# Patient Record
Sex: Male | Born: 1985 | Race: White | Hispanic: No | Smoking: Current every day smoker
Health system: Southern US, Community
[De-identification: ages and names within clinical notes are randomized; demographics above are authoritative.]

## PROBLEM LIST (undated history)

## (undated) DIAGNOSIS — F1910 Other psychoactive substance abuse, uncomplicated: Secondary | ICD-10-CM

## (undated) DIAGNOSIS — R569 Unspecified convulsions: Secondary | ICD-10-CM

## (undated) DIAGNOSIS — F1420 Cocaine dependence, uncomplicated: Secondary | ICD-10-CM

## (undated) DIAGNOSIS — I10 Essential (primary) hypertension: Secondary | ICD-10-CM

## (undated) DIAGNOSIS — F329 Major depressive disorder, single episode, unspecified: Secondary | ICD-10-CM

## (undated) DIAGNOSIS — B1920 Unspecified viral hepatitis C without hepatic coma: Secondary | ICD-10-CM

## (undated) DIAGNOSIS — F32A Depression, unspecified: Secondary | ICD-10-CM

## (undated) HISTORY — DX: Major depressive disorder, single episode, unspecified: F32.9

## (undated) HISTORY — DX: Essential (primary) hypertension: I10

## (undated) HISTORY — DX: Depression, unspecified: F32.A

---

## 2010-04-30 ENCOUNTER — Emergency Department (HOSPITAL_BASED_OUTPATIENT_CLINIC_OR_DEPARTMENT_OTHER)
Admission: EM | Admit: 2010-04-30 | Discharge: 2010-04-30 | Disposition: A | Discharge status: Home / Self Care | Payer: Medicaid - Out of State | Attending: Emergency Medicine | Admitting: Emergency Medicine

## 2010-04-30 DIAGNOSIS — F1120 Opioid dependence, uncomplicated: Secondary | ICD-10-CM | POA: Insufficient documentation

## 2010-04-30 DIAGNOSIS — F19939 Other psychoactive substance use, unspecified with withdrawal, unspecified: Secondary | ICD-10-CM | POA: Insufficient documentation

## 2010-04-30 LAB — CBC
HCT: 43.3 % (ref 39.0–52.0)
Hemoglobin: 15.6 g/dL (ref 13.0–17.0)
MCH: 29.1 pg (ref 26.0–34.0)
MCHC: 36 g/dL (ref 30.0–36.0)
MCV: 80.8 fL (ref 78.0–100.0)
RBC: 5.36 MIL/uL (ref 4.22–5.81)

## 2010-04-30 LAB — BASIC METABOLIC PANEL
BUN: 8 mg/dL (ref 6–23)
CO2: 28 mEq/L (ref 19–32)
Calcium: 9.8 mg/dL (ref 8.4–10.5)
Chloride: 104 mEq/L (ref 96–112)
Creatinine, Ser: 0.8 mg/dL (ref 0.4–1.5)
Glucose, Bld: 125 mg/dL — ABNORMAL HIGH (ref 70–99)

## 2010-04-30 LAB — DIFFERENTIAL
Basophils Relative: 0 % (ref 0–1)
Lymphocytes Relative: 12 % (ref 12–46)
Lymphs Abs: 1.1 10*3/uL (ref 0.7–4.0)
Monocytes Absolute: 0.5 10*3/uL (ref 0.1–1.0)
Monocytes Relative: 5 % (ref 3–12)
Neutro Abs: 7.4 10*3/uL (ref 1.7–7.7)

## 2010-05-02 ENCOUNTER — Emergency Department (HOSPITAL_BASED_OUTPATIENT_CLINIC_OR_DEPARTMENT_OTHER)
Admission: EM | Admit: 2010-05-02 | Discharge: 2010-05-02 | Disposition: A | Discharge status: Home / Self Care | Payer: Medicaid - Out of State | Attending: Emergency Medicine | Admitting: Emergency Medicine

## 2010-05-02 DIAGNOSIS — F17200 Nicotine dependence, unspecified, uncomplicated: Secondary | ICD-10-CM | POA: Insufficient documentation

## 2010-05-02 DIAGNOSIS — F1910 Other psychoactive substance abuse, uncomplicated: Secondary | ICD-10-CM | POA: Insufficient documentation

## 2012-03-04 ENCOUNTER — Encounter (HOSPITAL_COMMUNITY): Payer: Self-pay | Admitting: *Deleted

## 2012-03-04 ENCOUNTER — Emergency Department (HOSPITAL_COMMUNITY)
Admission: EM | Admit: 2012-03-04 | Discharge: 2012-03-05 | Disposition: A | Discharge status: Home / Self Care | Payer: Self-pay | Attending: Emergency Medicine | Admitting: Emergency Medicine

## 2012-03-04 DIAGNOSIS — F1020 Alcohol dependence, uncomplicated: Secondary | ICD-10-CM | POA: Clinically undetermined

## 2012-03-04 DIAGNOSIS — F1210 Cannabis abuse, uncomplicated: Secondary | ICD-10-CM | POA: Diagnosis present

## 2012-03-04 DIAGNOSIS — F1120 Opioid dependence, uncomplicated: Secondary | ICD-10-CM | POA: Diagnosis present

## 2012-03-04 DIAGNOSIS — F1910 Other psychoactive substance abuse, uncomplicated: Secondary | ICD-10-CM | POA: Insufficient documentation

## 2012-03-04 DIAGNOSIS — R110 Nausea: Secondary | ICD-10-CM | POA: Insufficient documentation

## 2012-03-04 DIAGNOSIS — F17200 Nicotine dependence, unspecified, uncomplicated: Secondary | ICD-10-CM | POA: Insufficient documentation

## 2012-03-04 HISTORY — DX: Other psychoactive substance abuse, uncomplicated: F19.10

## 2012-03-04 LAB — RAPID URINE DRUG SCREEN, HOSP PERFORMED
Amphetamines: NOT DETECTED
Benzodiazepines: NOT DETECTED
Opiates: POSITIVE — AB

## 2012-03-04 LAB — CBC
HCT: 45.7 % (ref 39.0–52.0)
MCH: 31.2 pg (ref 26.0–34.0)
MCV: 88.1 fL (ref 78.0–100.0)
Platelets: 282 10*3/uL (ref 150–400)
RDW: 12.8 % (ref 11.5–15.5)

## 2012-03-04 LAB — COMPREHENSIVE METABOLIC PANEL
AST: 34 U/L (ref 0–37)
Albumin: 3.8 g/dL (ref 3.5–5.2)
BUN: 10 mg/dL (ref 6–23)
Calcium: 9.3 mg/dL (ref 8.4–10.5)
Creatinine, Ser: 0.85 mg/dL (ref 0.50–1.35)
GFR calc non Af Amer: >90 mL/min (ref >90)
Total Bilirubin: 0.6 mg/dL (ref 0.3–1.2)

## 2012-03-04 LAB — ETHANOL: Alcohol, Ethyl (B): <11 mg/dL (ref 0–11)

## 2012-03-04 LAB — SALICYLATE LEVEL: Salicylate Lvl: <2 mg/dL — ABNORMAL LOW (ref 2.8–20.0)

## 2012-03-04 MED ORDER — CLONIDINE HCL 0.1 MG PO TABS: 0.1000 mg | ORAL_TABLET | Freq: Every day | ORAL | Status: DC

## 2012-03-04 MED ORDER — ONDANSETRON 4 MG PO TBDP
4.0000 mg | ORAL_TABLET | Freq: Four times a day (QID) | ORAL | Status: DC | PRN
  Administered 2012-03-05 (×2): 4 mg via ORAL
  Filled 2012-03-04 (×2): qty 1

## 2012-03-04 MED ORDER — CHLORDIAZEPOXIDE HCL 25 MG PO CAPS
25.0000 mg | ORAL_CAPSULE | Freq: Four times a day (QID) | ORAL | Status: DC | PRN
  Administered 2012-03-04 – 2012-03-05 (×3): 25 mg via ORAL
  Filled 2012-03-04 (×3): qty 1

## 2012-03-04 MED ORDER — ADULT MULTIVITAMIN W/MINERALS CH
1.0000 | ORAL_TABLET | Freq: Every day | ORAL | Status: DC
  Administered 2012-03-04 – 2012-03-05 (×2): 1 via ORAL
  Filled 2012-03-04: qty 1

## 2012-03-04 MED ORDER — ADULT MULTIVITAMIN W/MINERALS CH
1.0000 | ORAL_TABLET | Freq: Every day | ORAL | Status: DC
  Administered 2012-03-04: 1 via ORAL
  Filled 2012-03-04: qty 1

## 2012-03-04 MED ORDER — CLONIDINE HCL 0.1 MG PO TABS: 0.1000 mg | ORAL_TABLET | ORAL | Status: DC

## 2012-03-04 MED ORDER — LORAZEPAM 1 MG PO TABS
0.0000 mg | ORAL_TABLET | Freq: Four times a day (QID) | ORAL | Status: DC
  Administered 2012-03-04: 2 mg via ORAL
  Filled 2012-03-04: qty 2

## 2012-03-04 MED ORDER — VITAMIN B-1 100 MG PO TABS
100.0000 mg | ORAL_TABLET | Freq: Every day | ORAL | Status: DC
  Administered 2012-03-05: 100 mg via ORAL
  Filled 2012-03-04: qty 1

## 2012-03-04 MED ORDER — LORAZEPAM 1 MG PO TABS: 1.0000 mg | ORAL_TABLET | Freq: Four times a day (QID) | ORAL | Status: DC | PRN

## 2012-03-04 MED ORDER — THIAMINE HCL 100 MG/ML IJ SOLN: 100.0000 mg | Freq: Every day | INTRAMUSCULAR | Status: DC

## 2012-03-04 MED ORDER — FOLIC ACID 1 MG PO TABS
1.0000 mg | ORAL_TABLET | Freq: Every day | ORAL | Status: DC
  Administered 2012-03-04: 1 mg via ORAL
  Filled 2012-03-04: qty 1

## 2012-03-04 MED ORDER — THIAMINE HCL 100 MG/ML IJ SOLN: 100.0000 mg | Freq: Once | INTRAMUSCULAR | Status: DC

## 2012-03-04 MED ORDER — METHOCARBAMOL 500 MG PO TABS: 500.0000 mg | ORAL_TABLET | Freq: Three times a day (TID) | ORAL | Status: DC | PRN

## 2012-03-04 MED ORDER — DICYCLOMINE HCL 20 MG PO TABS
20.0000 mg | ORAL_TABLET | Freq: Four times a day (QID) | ORAL | Status: DC | PRN
  Administered 2012-03-05 (×2): 20 mg via ORAL
  Filled 2012-03-04 (×2): qty 1

## 2012-03-04 MED ORDER — VITAMIN B-1 100 MG PO TABS
100.0000 mg | ORAL_TABLET | Freq: Every day | ORAL | Status: DC
  Administered 2012-03-04: 100 mg via ORAL
  Filled 2012-03-04: qty 1

## 2012-03-04 MED ORDER — ACETAMINOPHEN 325 MG PO TABS: 650.0000 mg | ORAL_TABLET | ORAL | Status: DC | PRN

## 2012-03-04 MED ORDER — LORAZEPAM 1 MG PO TABS: 0.0000 mg | ORAL_TABLET | Freq: Two times a day (BID) | ORAL | Status: DC

## 2012-03-04 MED ORDER — LOPERAMIDE HCL 2 MG PO CAPS: 2.0000 mg | ORAL_CAPSULE | ORAL | Status: DC | PRN

## 2012-03-04 MED ORDER — NAPROXEN 500 MG PO TABS
500.0000 mg | ORAL_TABLET | Freq: Two times a day (BID) | ORAL | Status: DC | PRN
  Administered 2012-03-05 (×2): 500 mg via ORAL
  Filled 2012-03-04 (×2): qty 1

## 2012-03-04 MED ORDER — IBUPROFEN 200 MG PO TABS: 600.0000 mg | ORAL_TABLET | Freq: Three times a day (TID) | ORAL | Status: DC | PRN

## 2012-03-04 MED ORDER — LORAZEPAM 1 MG PO TABS: 1.0000 mg | ORAL_TABLET | Freq: Three times a day (TID) | ORAL | Status: DC | PRN

## 2012-03-04 MED ORDER — CLONIDINE HCL 0.1 MG PO TABS
0.1000 mg | ORAL_TABLET | Freq: Four times a day (QID) | ORAL | Status: DC
  Administered 2012-03-05 (×2): 0.1 mg via ORAL
  Filled 2012-03-04 (×3): qty 1

## 2012-03-04 MED ORDER — HYDROXYZINE HCL 25 MG PO TABS: 25.0000 mg | ORAL_TABLET | Freq: Four times a day (QID) | ORAL | Status: DC | PRN

## 2012-03-04 MED ORDER — LORAZEPAM 2 MG/ML IJ SOLN: 1.0000 mg | Freq: Four times a day (QID) | INTRAMUSCULAR | Status: DC | PRN

## 2012-03-04 NOTE — Progress Notes (Signed)
Pt referred to [facility], no detox beds available today.  Pt referred to [facility], pending review.  Pt referred to [facility] pending review.   Catha Gosselin, LCSWA  [phone] .03/04/2012 1627pm

## 2012-03-04 NOTE — ED Notes (Signed)
Given 1 coke to drink and 2 sandwiches to eat.

## 2012-03-04 NOTE — ED Notes (Signed)
Patient is here to detox off opiates and ETOH, wants to be clean and start life over as far as drugs are concerned. Pleasant and cooperative, affect blunt, easy to talk to. Just wants to get clean.

## 2012-03-04 NOTE — ED Notes (Signed)
Pt changed in blue scrubs, 2 pt belonging bags at desk, security called for wanding.

## 2012-03-04 NOTE — Progress Notes (Addendum)
Per discussion with [facility], [facility] to [facility] for detox. Pt requiring assistance for transportation. Pt will be provided train ticket by csw for tomorrow. Day CSW will follow up with [facility] regarding patient arrival time to train station.   Doree Albee  [phone] 03/04/2012 1804pm   Addendum:   Pt agreed to be transferred to [facility] for detox treatment. Day CSW will provide patient with bus pass, and train ticket. [facility] will provide transportation for patient from train station to facility. Patient provided with outpatient substance abuse resources as well as counseling services for once patient finishes detox and waiting for residential treatment. CSW unable to obtain authorization since patient is discharging to [facility] tomorrow. CSW informed act team member to see if oncoming act team can obtain authorization after midnight tonight.   Catha Gosselin, LCSWA  [phone] .03/04/2012 1804pm

## 2012-03-04 NOTE — ED Notes (Signed)
Pt states he's here for detox from opiates (heroin)  and alcohol, states last time used both was yesterday at 2 pm, usually drinks about 4 40's a day, usually uses 2 bags of heroin a day, denies SI/HI. Pt states feeling anxious and having abdominal pain right now.

## 2012-03-04 NOTE — BH Assessment (Addendum)
Assessment Note   Thomas Benitez is an 27 y.o. male who presents to the ED requesting detox from heroine and alcohol. CSW met with pt at bedside to complete [facility] assessment. Pt denies SI/AH/VH/HI. Pt reports drinking 4 40oz beers per day for the past 5 months. Pt last drink was yesterday which was 1 40 oz beer. Pt reports using 2 bags of heroine per day, for the past 5 months. Pt reports his last use was yesterday. Pt reports that he has been on and off of heroine and alcohol. Pt reports that this past month he has increased his usage as patient reports feeling depressed. Patient states that he wants to get of drugs and alcohol and follow up with [facility] for residential rehab program once completing detox.   Pt reports symptoms of depression including: increased irritability, decreased appetite (10 lbs lost over past monhth), increased fatigue, thoughts of hoplessness, and isolating.   Pt reports having a few friends, but no real support.   Addendum: Pt reports occasional marijuana use, pt reports only a hit every now and then including yesterday.    Axis I: Polysubstance abuse , depressive disorder nos  Axis II: Deferred Axis III:  Past Medical History  Diagnosis Date  . Substance abuse    Axis IV: other psychosocial or environmental problems, problems related to social environment and problems with primary support group Axis V: 41-50 serious symptoms  Past Medical History:  Past Medical History  Diagnosis Date  . Substance abuse     History reviewed. No pertinent past surgical history.  Family History: History reviewed. No pertinent family history.  Social History:  reports that he has been smoking.  He has never used smokeless tobacco. He reports that  drinks alcohol. He reports that he uses illicit drugs (Marijuana).  Additional Social History:  Alcohol / Drug Use History of alcohol / drug use?: Yes Substance #1 Name of Substance 1: Etoh  1 - Age of First Use: 12 1 -  Amount (size/oz): 4 4 locos 1 - Frequency: daily  1 - Duration: 5 months  1 - Last Use / Amount: yesterday 1 40 oz beer  Substance #2 Name of Substance 2: heroine 2 - Age of First Use: 19  2 - Amount (size/oz): 2 bags  2 - Frequency: daily  2 - Duration: 5 months 2 - Last Use / Amount: yesterday 2 bags  CIWA: CIWA-Ar BP: 120/77 mmHg Pulse Rate: 66 Nausea and Vomiting: mild nausea with no vomiting Tactile Disturbances: mild itching, pins and needles, burning or numbness Tremor: two Auditory Disturbances: not present Paroxysmal Sweats: two Visual Disturbances: not present Anxiety: two Headache, Fullness in Head: very mild Agitation: three Orientation and Clouding of Sensorium: oriented and can do serial additions CIWA-Ar Total: 13 COWS: Clinical Opiate Withdrawal Scale (COWS) Resting Pulse Rate: Pulse Rate 80 or below Sweating: Subjective report of chills or flushing Restlessness: Frequent shifting or extraneous movements of legs/arms Pupil Size: Pupils pinned or normal size for room light Bone or Joint Aches: Mild diffuse discomfort Runny Nose or Tearing: Not present GI Upset: nausea or loose stool Tremor: Slight tremor observable Yawning: No yawning Anxiety or Irritability: Patient reports increasing irritability or anxiousness Gooseflesh Skin: Skin is smooth COWS Total Score: 10  Allergies:  Allergies  Allergen Reactions  . Shellfish Allergy     Home Medications:  (Not in a hospital admission)  OB/GYN Status:  No LMP for male patient.  General Assessment Data Location of Assessment: [facility]  Living Arrangements: Alone Can pt return to current living arrangement?: Yes Admission Status: Voluntary Is patient capable of signing voluntary admission?: Yes Transfer from: Home Referral Source: Self/Family/Friend  Education Status Is patient currently in school?: No Highest grade of school patient has completed: 9th  Risk to self Suicidal Ideation: No Suicidal  Intent: No Is patient at risk for suicide?: No Suicidal Plan?: No Access to Means: No What has been your use of drugs/alcohol within the last 12 months?: heroine and alcohol Previous Attempts/Gestures: No How many times?: 0 Other Self Harm Risks: no Triggers for Past Attempts: None known Intentional Self Injurious Behavior: None Family Suicide History: No Recent stressful life event(s): Conflict (Comment);Loss (Comment) Persecutory voices/beliefs?: No Depression: Yes Depression Symptoms: Despondent;Insomnia;Isolating;Fatigue;Loss of interest in usual pleasures;Feeling worthless/self pity;Feeling angry/irritable Substance abuse history and/or treatment for substance abuse?: Yes  Risk to Others Homicidal Ideation: No Thoughts of Harm to Others: No Current Homicidal Intent: No Current Homicidal Plan: No Access to Homicidal Means: No Identified Victim: n/a History of harm to others?: No Assessment of Violence: None Noted Violent Behavior Description: none Does patient have access to weapons?: No Criminal Charges Pending?: No Does patient have a court date: No  Psychosis Hallucinations: None noted Delusions: None noted  Mental Status Report Appear/Hygiene: Other (Comment) (calm and casual) Eye Contact: Good Motor Activity: Freedom of movement Speech: Logical/coherent Level of Consciousness: Alert;Irritable Mood: Irritable Affect: Appropriate to circumstance Anxiety Level: Minimal Thought Processes: Coherent;Relevant Judgement: Unimpaired Orientation: Person;Place;Time;Situation Obsessive Compulsive Thoughts/Behaviors: None  Cognitive Functioning Concentration: Normal Memory: Recent Intact;Remote Intact IQ: Average Insight: Fair Impulse Control: Poor Appetite: Fair Weight Loss: 10 Sleep: No Change Total Hours of Sleep: 4 Vegetative Symptoms: None  ADLScreening [facility]) Patient's cognitive ability adequate to safely complete daily activities?:  Yes Patient able to express need for assistance with ADLs?: Yes Independently performs ADLs?: Yes (appropriate for developmental age)  Abuse/Neglect [facility]) Physical Abuse: Denies Verbal Abuse: Denies Sexual Abuse: Denies  Prior Inpatient Therapy Prior Inpatient Therapy: Yes Prior Therapy Dates: 2013  Prior Therapy Facilty/Provider(s): [facility] Reason for Treatment: etoh   Prior Outpatient Therapy Prior Outpatient Therapy: No Prior Therapy Dates: n/a Prior Therapy Facilty/Provider(s): n/a Reason for Treatment: n/a  ADL Screening (condition at time of admission) Patient's cognitive ability adequate to safely complete daily activities?: Yes Patient able to express need for assistance with ADLs?: Yes Independently performs ADLs?: Yes (appropriate for developmental age)       Abuse/Neglect Assessment (Assessment to be complete while patient is alone) Physical Abuse: Denies Verbal Abuse: Denies Sexual Abuse: Denies Values / Beliefs Cultural Requests During Hospitalization: None Spiritual Requests During Hospitalization: None        Additional Information 1:1 In Past 12 Months?: No CIRT Risk: No Elopement Risk: No Does patient have medical clearance?: Yes     Disposition:  Disposition Disposition of Patient: Inpatient treatment program Type of inpatient treatment program: Adult  On Site Evaluation by:   Reviewed with Physician:     Catha Gosselin A 03/04/2012 3:23 PM

## 2012-03-04 NOTE — Consult Note (Signed)
Reason for Consult: Opiate dependence alcohol dependence seeking for detox treatment Referring Physician: Dr. Lemar Livings is an 27 y.o. male.  HPI: Patient was seen and chart reviewed. Patient came voluntarily to the [facility] long emergency department requesting detox treatment for opiates and alcohol. Patient reportedly has been using the elbow drugs and multiple other drugs since he was in high school. Patient stated he started using as a recreational with friends and now he likes using it and he denies getting high. Reportedly patient lived in [place] until 2 years ago where his father lives. He was visiting his mother and likes to get and then decided to stay here. 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 Patient stated when he does not get enough for drugs he gets sick and cannot work which is difficult for him not to get drugs. Patient stated he was sick nauseated shaking sweating hot and cold clammy. Patient stated he wanted to quit using drugs this time. Patient has a previous detox treatment at [facility] and also rehabilitation services at [facility] recovery. Patient has denied previously behavioral health admissions or treatments.   MSE: This is a young Caucasian male poorly groomed, decreased psychomotor activity, calm, and cooperative. He has normal rate rhythm and volume of speech and thought processes are linear and goal-directed. Patient has denied suicidal onset ideation intents or plans, Patient has no evidence of psychotic symptoms.   Past Medical History  Diagnosis Date  . Substance abuse     History reviewed. No pertinent past surgical history.  History reviewed. No pertinent family history.  Social History:  reports that he has been smoking.  He has never used smokeless tobacco. He reports that  drinks alcohol. He reports that he uses illicit drugs (Marijuana).  Allergies:  Allergies   Allergen Reactions  . Shellfish Allergy     Medications: I have reviewed the patient's current medications.  Results for orders placed during the hospital encounter of 03/04/12 (from the past 48 hour(s))  ACETAMINOPHEN LEVEL     Status: None   Collection Time    03/04/12  1:06 PM      Result Value Range   Acetaminophen (Tylenol), Serum <15.0  10 - 30 ug/mL   Comment:            THERAPEUTIC CONCENTRATIONS VARY     SIGNIFICANTLY. A RANGE OF 10-30     ug/mL MAY BE AN EFFECTIVE     CONCENTRATION FOR MANY PATIENTS.     HOWEVER, SOME ARE BEST TREATED     AT CONCENTRATIONS OUTSIDE THIS     RANGE.     ACETAMINOPHEN CONCENTRATIONS     >150 ug/mL AT 4 HOURS AFTER     INGESTION AND >50 ug/mL AT 12     HOURS AFTER INGESTION ARE     OFTEN ASSOCIATED WITH TOXIC     REACTIONS.  CBC     Status: None   Collection Time    03/04/12  1:06 PM      Result Value Range   WBC 7.5  4.0 - 10.5 K/uL   RBC 5.19  4.22 - 5.81 MIL/uL   Hemoglobin 16.2  13.0 - 17.0 g/dL   HCT 40.9  81.1 - 91.4 %   MCV 88.1  78.0 - 100.0 fL   MCH 31.2  26.0 - 34.0 pg   MCHC 35.4  30.0 - 36.0 g/dL  RDW 12.8  11.5 - 15.5 %   Platelets 282  150 - 400 K/uL  COMPREHENSIVE METABOLIC PANEL     Status: Abnormal   Collection Time    03/04/12  1:06 PM      Result Value Range   Sodium 135  135 - 145 mEq/L   Potassium 4.0  3.5 - 5.1 mEq/L   Chloride 97  96 - 112 mEq/L   CO2 29  19 - 32 mEq/L   Glucose, Bld 155 (*) 70 - 99 mg/dL   BUN 10  6 - 23 mg/dL   Creatinine, Ser 1.61  0.50 - 1.35 mg/dL   Calcium 9.3  8.4 - 09.6 mg/dL   Total Protein 7.5  6.0 - 8.3 g/dL   Albumin 3.8  3.5 - 5.2 g/dL   AST 34  0 - 37 U/L   ALT 53  0 - 53 U/L   Alkaline Phosphatase 53  39 - 117 U/L   Total Bilirubin 0.6  0.3 - 1.2 mg/dL   GFR calc non Af Amer >90  >90 mL/min   GFR calc Af Amer >90  >90 mL/min   Comment:            The eGFR has been calculated     using the CKD EPI equation.     This calculation has not been     validated in  all clinical     situations.     eGFR's persistently     <90 mL/min signify     possible Chronic Kidney Disease.  ETHANOL     Status: None   Collection Time    03/04/12  1:06 PM      Result Value Range   Alcohol, Ethyl (B) <11  0 - 11 mg/dL   Comment:            LOWEST DETECTABLE LIMIT FOR     SERUM ALCOHOL IS 11 mg/dL     FOR MEDICAL PURPOSES ONLY  SALICYLATE LEVEL     Status: Abnormal   Collection Time    03/04/12  1:06 PM      Result Value Range   Salicylate Lvl <2.0 (*) 2.8 - 20.0 mg/dL  URINE RAPID DRUG SCREEN (HOSP PERFORMED)     Status: Abnormal   Collection Time    03/04/12  1:10 PM      Result Value Range   Opiates POSITIVE (*) NONE DETECTED   Cocaine NONE DETECTED  NONE DETECTED   Benzodiazepines NONE DETECTED  NONE DETECTED   Amphetamines NONE DETECTED  NONE DETECTED   Tetrahydrocannabinol POSITIVE (*) NONE DETECTED   Barbiturates NONE DETECTED  NONE DETECTED   Comment:            DRUG SCREEN FOR MEDICAL PURPOSES     ONLY.  IF CONFIRMATION IS NEEDED     FOR ANY PURPOSE, NOTIFY LAB     WITHIN 5 DAYS.                LOWEST DETECTABLE LIMITS     FOR URINE DRUG SCREEN     Drug Class       Cutoff (ng/mL)     Amphetamine      1000     Barbiturate      200     Benzodiazepine   200     Tricyclics       300     Opiates          300  Cocaine          300     THC              50    No results found.  Positive for anxiety, bad mood, illegal drug usage, mood swings, sleep disturbance and tobacco use Blood pressure 120/77, pulse 66, temperature 97.9 F (36.6 C), temperature source Oral, resp. rate 18, SpO2 96.00%.   Assessment/Plan: Polysubstance dependence Opiate dependence Alcohol abuse Cannabis abuse  Recommendation: Patient will benefit from inpatient alcohol and opiate detox treatment program and then will be referred to the [facility] recovery services for rehabilitation. Patient will start Librium protocol and also Clonidine.   JONNALAGADDA,JANARDHAHA  R. 03/04/2012, 5:02 PM

## 2012-03-04 NOTE — ED Provider Notes (Signed)
History     CSN: [id number]  Arrival date & time 03/04/12  1244   First MD Initiated Contact with Patient 03/04/12 1347      Chief Complaint  Patient presents with  . Medical Clearance  . detox     (Consider location/radiation/quality/duration/timing/severity/associated sxs/prior treatment) HPI Comments: Patient presenting today requesting detox from Heroin and from Alcohol.  Patient reports that he typically drinks four 40's daily.  He also uses Heroin daily.  He reports that he went through detox three months ago, but began using again.  He also occasionally uses Marijuana.  He denies any other recreational drug use.  He denies SI or HI.  He reports that he is feeling anxious, nauseous, and is having mild abdominal pain at this time, which he attributes to alcohol withdrawal.  Last drink was 2 PM yesterday.  The history is provided by the patient.    Past Medical History  Diagnosis Date  . Substance abuse     History reviewed. No pertinent past surgical history.  History reviewed. No pertinent family history.  History  Substance Use Topics  . Smoking status: Current Every Day Smoker  . Smokeless tobacco: Never Used  . Alcohol Use: Yes     Comment: 4 40's a day      Review of Systems  Constitutional: Negative for fever and chills.  Respiratory: Negative for shortness of breath.   Gastrointestinal: Positive for nausea. Negative for vomiting.  Neurological: Negative for dizziness, tremors, syncope, light-headedness and headaches.  Psychiatric/Behavioral: Negative for suicidal ideas, hallucinations and confusion.  All other systems reviewed and are negative.    Allergies  Shellfish allergy  Home Medications  No current outpatient prescriptions on file.  BP 120/77  Pulse 66  Temp(Src) 97.9 F (36.6 C) (Oral)  Resp 18  SpO2 96%  Physical Exam  Nursing note and vitals reviewed. Constitutional: He appears well-developed and well-nourished. No distress.   HENT:  Head: Normocephalic and atraumatic.  Mouth/Throat: Oropharynx is clear and moist.  Eyes: EOM are normal. Pupils are equal, round, and reactive to light.  Neck: Normal range of motion. Neck supple.  Cardiovascular: Normal rate, regular rhythm and normal heart sounds.   Pulmonary/Chest: Effort normal and breath sounds normal.  Abdominal: Soft. Bowel sounds are normal. He exhibits no distension and no mass. There is no tenderness. There is no rebound and no guarding.  Neurological: He is alert.  Skin: Skin is warm and dry. He is not diaphoretic.  Psychiatric: He has a normal mood and affect.    ED Course  Procedures (including critical care time)  Labs Reviewed  COMPREHENSIVE METABOLIC PANEL - Abnormal; Notable for the following:    Glucose, Bld 155 (*)    All other components within normal limits  SALICYLATE LEVEL - Abnormal; Notable for the following:    Salicylate Lvl <2.0 (*)    All other components within normal limits  URINE RAPID DRUG SCREEN (HOSP PERFORMED) - Abnormal; Notable for the following:    Opiates POSITIVE (*)    Tetrahydrocannabinol POSITIVE (*)    All other components within normal limits  ACETAMINOPHEN LEVEL  CBC  ETHANOL   No results found.   No diagnosis found.  Discussed with ACT team.  MDM  Patient requesting detox from Heroin and Alcohol.  Patient uses both daily.  He denies SI or HI.  Discussed the patient with the ACT team.  CIWA orders have been placed.  Psych holding orders have also been placed.  Pascal Lux Th[place], PA-C 03/04/12 1551

## 2012-03-05 MED ORDER — NICOTINE 21 MG/24HR TD PT24
21.0000 mg | MEDICATED_PATCH | Freq: Every day | TRANSDERMAL | Status: DC
  Administered 2012-03-05: 21 mg via TRANSDERMAL
  Filled 2012-03-05: qty 1

## 2012-03-05 NOTE — Treatment Plan (Signed)
Based on pt's clinical presentation he does not meet criteria for inpatient psych services.  He denies SI, HI, and psychosis.  He is positive for opiates and reports ETOH use, however his ETOH level was negative on admission to the ED.  Opioid detox in the absence of ETOH, BENZOS, SI, HI, or psychosis is an outpatient service.

## 2012-03-05 NOTE — BHH Counselor (Signed)
Patient referred to [facility] but declined due to previous behaviors. Patient referred to [facility] and accepted pending authorization. Although patient was accepted to [facility], nature was informed by SW-Gina that patient does not have ID which is mandatory for transportation via train.   SW then referred patient to [facility]. Patient was reviewed by AC-Eric and he stated that patient does not meet criteria. Sts that patient has no alcohol in his system and positive for opiates only. Patient reports that his last drink was 03-03-2012. Writer and [facility] discussed that patient would remain in the ED until tomorrow and discharge home to follow-up with a appropriate substance abuse program such as residential, outpatient,etc.  Clinical research associate shared this plan during the treatment team meeting and everyone involved agreed with plan.  Writer discussed with patient the plan of care which was also discussed during treatment team meeting. Explained to patient that the plan is for him to remain in the ED until tomorrow morning and then he will discharge home to follow up with residential, outpatient programs such as [facility]. Patient stated, "I really want treatment at [facility]". Writer offered to schedule an appointment for patient at [facility]. Patient stated, "No thanks I will schedule my own appointment"...."I'm just ready to go now"...."I don't want to go home now". EDP informed that patient wanted to be discharge. Patient given various substance abuse referrals including [facility].

## 2012-03-05 NOTE — ED Provider Notes (Addendum)
Pt seeking opiate and alcohol detox/rehab. Has been accepted to [facility], to go there is morning. Pt resting comfortably. Nad.   Suzi Roots, MD 03/05/12 [phone]  Act team indicates pt declines at [facility], and does not want to go to [facility]. States [facility] declines admission feeling pt appropriate for outpt referrals.   Act team indicates pt requests d/c to home.  Will provide resource guide.     Suzi Roots, MD 03/05/12 [phone]

## 2012-03-05 NOTE — BHH Counselor (Signed)
Per shift report patient was referred to [facility] 03/05/2012 and would need a authorization. This am Clinical research associate followed up by contacting [facility] this to request the authorization. Their was no response, therefore; writer  left a detailed voicemail requesting a call back.  Meanwhile Clinical research associate met with patient to discuss his disposition. Patient sts that he does not want to go to [facility] and told staff that last night. Writer informed patient that [facility] and [facility] were the 2 local detox facilities. Patient stated that he would prefer [facility]. Writer informed patient that [facility] would be contacted and he would be referred. However, patient was made aware that if [facility] or does not have beds or if he is declined [facility] may be his next option. Patient agreed to the plan stating, "I understand that will be fine".  Writer contacted [facility] and per their staff-Shayla patient is not able return to the facility due to previous behaviors. Writer will continue with placement at [facility] and patient made aware of plan.

## 2012-03-05 NOTE — Clinical Social Work Note (Signed)
Treatment tx reviewed pt chart and discussed disposition.  ACT team has now taken over pt care.  CSW is signing off; however will remain available if needed.  Vickii Penna, LCSWA [phone]  Clinical Social Work

## 2012-03-05 NOTE — Clinical Social Work Note (Addendum)
11:50am- CSW met with pt at bedside.  Pt stated that he did not have a valid photo ID.  Pt also stated that he was not from [place] and had no family here other than his mother and she is not willing to assist with this transportation need.  At this time, pt not having a valid photo ID, pt will not be able to be transported to [facility] via train as you must have a valid ID.  CSW requested pt to be run at [facility] since [facility] may no longer be an option.  CSW faxed over Assessment Verification and followed up with a phone call requesting pt to be run and made [facility] aware of the situation change.  CSW will continue to f/u.  11:40am CSW checked train departure times: 1:34pm and 6:49pm.  CSW will also need to verify that pt has valid state issued ID in order to ride the train.  CSW will assess.    CSW called [facility] for authorization for detox.  [phone].  CSW left message requesting a return call for authorization.  CSW was advised by ACT that no authorization had been received.  Per ACT - ACT has also left a message to request an authorization.  CSW will continue to f/u.    Vickii Penna, LCSWA [phone]  Clinical Social Work

## 2012-03-06 NOTE — ED Provider Notes (Signed)
Medical screening examination/treatment/procedure(s) were performed by non-physician practitioner and as supervising physician I was immediately available for consultation/collaboration.  Anthony T Allen, MD 03/06/12 1350 

## 2012-04-05 ENCOUNTER — Encounter (HOSPITAL_COMMUNITY): Payer: Self-pay | Admitting: Emergency Medicine

## 2012-04-05 ENCOUNTER — Emergency Department (HOSPITAL_COMMUNITY)
Admission: EM | Admit: 2012-04-05 | Discharge: 2012-04-08 | Disposition: A | Discharge status: Home / Self Care | Payer: Medicaid - Out of State | Attending: Emergency Medicine | Admitting: Emergency Medicine

## 2012-04-05 DIAGNOSIS — F411 Generalized anxiety disorder: Secondary | ICD-10-CM | POA: Insufficient documentation

## 2012-04-05 DIAGNOSIS — F1010 Alcohol abuse, uncomplicated: Secondary | ICD-10-CM

## 2012-04-05 DIAGNOSIS — R45851 Suicidal ideations: Secondary | ICD-10-CM

## 2012-04-05 DIAGNOSIS — F1210 Cannabis abuse, uncomplicated: Secondary | ICD-10-CM

## 2012-04-05 DIAGNOSIS — F1120 Opioid dependence, uncomplicated: Secondary | ICD-10-CM

## 2012-04-05 DIAGNOSIS — F1410 Cocaine abuse, uncomplicated: Secondary | ICD-10-CM | POA: Insufficient documentation

## 2012-04-05 DIAGNOSIS — R0789 Other chest pain: Secondary | ICD-10-CM | POA: Insufficient documentation

## 2012-04-05 DIAGNOSIS — R110 Nausea: Secondary | ICD-10-CM | POA: Insufficient documentation

## 2012-04-05 DIAGNOSIS — R450 Nervousness: Secondary | ICD-10-CM | POA: Insufficient documentation

## 2012-04-05 DIAGNOSIS — F1910 Other psychoactive substance abuse, uncomplicated: Secondary | ICD-10-CM

## 2012-04-05 DIAGNOSIS — R509 Fever, unspecified: Secondary | ICD-10-CM | POA: Insufficient documentation

## 2012-04-05 DIAGNOSIS — F14929 Cocaine use, unspecified with intoxication, unspecified: Secondary | ICD-10-CM

## 2012-04-05 DIAGNOSIS — F17200 Nicotine dependence, unspecified, uncomplicated: Secondary | ICD-10-CM | POA: Insufficient documentation

## 2012-04-05 LAB — RAPID URINE DRUG SCREEN, HOSP PERFORMED
Amphetamines: NOT DETECTED
Benzodiazepines: NOT DETECTED
Cocaine: POSITIVE — AB
Opiates: NOT DETECTED
Tetrahydrocannabinol: POSITIVE — AB

## 2012-04-05 LAB — CBC
HCT: 42 % (ref 39.0–52.0)
MCH: 31.1 pg (ref 26.0–34.0)
MCHC: 36.4 g/dL — ABNORMAL HIGH (ref 30.0–36.0)
MCV: 85.4 fL (ref 78.0–100.0)
Platelets: 288 10*3/uL (ref 150–400)
RDW: 12.5 % (ref 11.5–15.5)
WBC: 9.2 10*3/uL (ref 4.0–10.5)

## 2012-04-05 NOTE — ED Notes (Signed)
Pt requesting detox from etoh(last drink 20 min ago), heroin (yesterday) and crack (yesterday). Pt denies SI/HI put states he is very depressed.

## 2012-04-05 NOTE — ED Notes (Signed)
Pt changed into scrubs.  One bag of personal belongings.  Seen and wand by security.

## 2012-04-06 LAB — COMPREHENSIVE METABOLIC PANEL
AST: 55 U/L — ABNORMAL HIGH (ref 0–37)
Albumin: 3.9 g/dL (ref 3.5–5.2)
BUN: 12 mg/dL (ref 6–23)
Calcium: 9.5 mg/dL (ref 8.4–10.5)
Chloride: 101 mEq/L (ref 96–112)
Creatinine, Ser: 0.8 mg/dL (ref 0.50–1.35)
Total Bilirubin: 0.4 mg/dL (ref 0.3–1.2)
Total Protein: 7.8 g/dL (ref 6.0–8.3)

## 2012-04-06 LAB — ETHANOL: Alcohol, Ethyl (B): <11 mg/dL (ref 0–11)

## 2012-04-06 MED ORDER — NICOTINE 21 MG/24HR TD PT24
21.0000 mg | MEDICATED_PATCH | Freq: Once | TRANSDERMAL | Status: DC
  Administered 2012-04-06: 21 mg via TRANSDERMAL
  Filled 2012-04-06 (×2): qty 1

## 2012-04-06 MED ORDER — ONDANSETRON HCL 4 MG PO TABS
4.0000 mg | ORAL_TABLET | Freq: Three times a day (TID) | ORAL | Status: DC | PRN
  Administered 2012-04-06: 4 mg via ORAL
  Filled 2012-04-06: qty 1

## 2012-04-06 MED ORDER — ONDANSETRON HCL 4 MG PO TABS: 4.0000 mg | ORAL_TABLET | Freq: Three times a day (TID) | ORAL | Status: DC | PRN

## 2012-04-06 MED ORDER — ONDANSETRON 4 MG PO TBDP: 4.0000 mg | ORAL_TABLET | Freq: Four times a day (QID) | ORAL | Status: DC | PRN

## 2012-04-06 MED ORDER — NAPROXEN 500 MG PO TABS: 500.0000 mg | ORAL_TABLET | Freq: Two times a day (BID) | ORAL | Status: DC | PRN

## 2012-04-06 MED ORDER — METHOCARBAMOL 500 MG PO TABS: 500.0000 mg | ORAL_TABLET | Freq: Three times a day (TID) | ORAL | Status: DC | PRN

## 2012-04-06 MED ORDER — IBUPROFEN 600 MG PO TABS
600.0000 mg | ORAL_TABLET | Freq: Three times a day (TID) | ORAL | Status: DC | PRN
  Administered 2012-04-07: 600 mg via ORAL
  Filled 2012-04-06: qty 1

## 2012-04-06 MED ORDER — LOPERAMIDE HCL 2 MG PO CAPS: 2.0000 mg | ORAL_CAPSULE | ORAL | Status: DC | PRN

## 2012-04-06 MED ORDER — LORAZEPAM 1 MG PO TABS
1.0000 mg | ORAL_TABLET | Freq: Three times a day (TID) | ORAL | Status: DC | PRN
  Administered 2012-04-06 – 2012-04-08 (×6): 1 mg via ORAL
  Filled 2012-04-06 (×6): qty 1

## 2012-04-06 MED ORDER — DICYCLOMINE HCL 20 MG PO TABS: 20.0000 mg | ORAL_TABLET | Freq: Four times a day (QID) | ORAL | Status: DC | PRN

## 2012-04-06 MED ORDER — ACETAMINOPHEN 325 MG PO TABS: 650.0000 mg | ORAL_TABLET | ORAL | Status: DC | PRN

## 2012-04-06 MED ORDER — HYDROXYZINE HCL 25 MG PO TABS
25.0000 mg | ORAL_TABLET | Freq: Four times a day (QID) | ORAL | Status: DC | PRN
  Administered 2012-04-07: 25 mg via ORAL
  Filled 2012-04-06: qty 1

## 2012-04-06 NOTE — ED Provider Notes (Signed)
Medical screening examination/treatment/procedure(s) were performed by non-physician practitioner and as supervising physician I was immediately available for consultation/collaboration.    Vida Roller, MD 04/06/12 [phone]

## 2012-04-06 NOTE — BHH Counselor (Signed)
Ardelia Mems, assessment staff at [facility], submitted Pt for admission to [facility]. Laverle Hobby, [facility] confirmed bed availability. Armandina Stammer, NP reviewed clinical information and declined Pt citing Pt does not meet criteria for inpatient alcohol detox or mental health crisis stabilization. Notified Ardelia Mems of disposition.  Harlin Rain Patsy Baltimore, LPC, [facility] Assessment Counselor

## 2012-04-06 NOTE — ED Notes (Signed)
Patient pleasant and cooperative with staff. Pt c/o anxiety; Ativan given PRN as ordered. Pt denies SI at this time. No s/s of distress noted.

## 2012-04-06 NOTE — ED Provider Notes (Signed)
History     CSN: [id number]  Arrival date & time 04/05/12  2217   First MD Initiated Contact with Patient 04/05/12 2343      Chief Complaint  Patient presents with  . Drug / Alcohol Assessment    (Consider location/radiation/quality/duration/timing/severity/associated sxs/prior treatment) HPI  Patient is 27 yo M PMHx substance abuse presenting to ED for ETOH detox. Has history of ETOH, cocaine, and heroin abuse. Last use ETOH tonight prior to ED, cocaine yesterday, heroin yesterday. Patient has never been hospitalized for withdrawals, no history of ETOH withdrawal seizures. Has previously tried to go through detox, but has been unsuccessful. Last attempt was one month ago, patient left after 24 hours of detox. No suicidal or homicidal ideations, no auditory or visual hallucinations. No other PMHx. Patient has nausea, chills, and anxiety.   Past Medical History  Diagnosis Date  . Substance abuse     History reviewed. No pertinent past surgical history.  No family history on file.  History  Substance Use Topics  . Smoking status: Current Every Day Smoker  . Smokeless tobacco: Never Used  . Alcohol Use: Yes     Comment: 4 40's a day      Review of Systems  Constitutional: Positive for fever.  HENT: Negative.   Eyes: Negative.   Respiratory: Positive for chest tightness. Negative for cough, shortness of breath and wheezing.   Cardiovascular: Negative for chest pain.  Gastrointestinal: Positive for nausea. Negative for abdominal pain.  Genitourinary: Negative.   Skin: Negative.   Neurological: Negative.   Psychiatric/Behavioral: Negative for sleep disturbance. The patient is nervous/anxious.     Allergies  Shellfish allergy  Home Medications  No current outpatient prescriptions on file.  BP 128/92  Pulse 76  Temp(Src) 98 F (36.7 C) (Oral)  Resp 20  Wt 161 lb (73.029 kg)  SpO2 96%  Physical Exam  Constitutional: He is oriented to person, place, and time. He  appears well-developed and well-nourished. No distress.  HENT:  Head: Normocephalic and atraumatic.  Mouth/Throat: Oropharynx is clear and moist.  Eyes: Conjunctivae are normal.  Neck: Neck supple.  Cardiovascular: Normal rate, regular rhythm, normal heart sounds and intact distal pulses.   Pulmonary/Chest: Effort normal and breath sounds normal. No respiratory distress. He has no wheezes. He has no rales. He exhibits no tenderness.  Abdominal: Soft. There is no tenderness.  Neurological: He is alert and oriented to person, place, and time.  Skin: Skin is warm and dry. No rash noted. He is not diaphoretic.  Psychiatric: His speech is normal and behavior is normal. Judgment and thought content normal. His mood appears anxious. Cognition and memory are normal.    ED Course  Procedures (including critical care time)  Patient offered option of waiting for ACT consult for possible placement or phone numbers for outpatient detox clinics to contact himself. Patient opting to stay for ACT team.    ACT consulted.   Psych hold orders in place.   Medications  LORazepam (ATIVAN) tablet 1 mg (not administered)  ondansetron (ZOFRAN) tablet 4 mg (not administered)     Labs Reviewed  CBC - Abnormal; Notable for the following:    MCHC 36.4 (*)    All other components within normal limits  URINE RAPID DRUG SCREEN (HOSP PERFORMED) - Abnormal; Notable for the following:    Cocaine POSITIVE (*)    Tetrahydrocannabinol POSITIVE (*)    All other components within normal limits  COMPREHENSIVE METABOLIC PANEL  ETHANOL   No  results found.   No diagnosis found.    MDM  Patient is a 27 yo M PMHx significant for ETOH, cocaine, and heroin substance abuse presenting to ED requesting detox services. Last use ETOH tonight prior to ED, cocaine yesterday, heroin yesterday. Patient has never been hospitalized for withdrawals, no history of ETOH withdrawal seizures. No SI/HI, hallucinations. Patient does  not appear to be actively withdrawing in ED. Physical exam unremarkable. Drug screen results as noted above. Labs unremarkable. Psych hold orders in place while patient waits for ACT consult. Patient is medically cleared and stable at this point. Patient d/w with Dr. Hyacinth Meeker, agrees with plan. Patient agreeable to plan. Care of patient was given to Dr. Dierdre Highman at shift change. Patient stable at time of shift change and waiting for ACT team consult.          Jeannetta Ellis, PA-C 04/06/12 [phone]

## 2012-04-06 NOTE — ED Notes (Signed)
One bag pt belongings placed in locker 29

## 2012-04-06 NOTE — ED Notes (Signed)
Tech came in another pts room to get inform that pt had stormed out of room in a attempt to leave d/t not liking the results of his telepsych. Pt was then escorted back to room by charge nurse and security seeming upset. Pt was put back in blue scrubs and pt got back in bed. Sitter in place.

## 2012-04-06 NOTE — ED Notes (Signed)
Report given to jeanie, rn in psych ed. Vw, rn.

## 2012-04-06 NOTE — ED Provider Notes (Signed)
Pt resting, nad, vitals normal. telepsych rec inpt psych. Act placement of pt pending.   Suzi Roots, MD 04/06/12 [phone]

## 2012-04-06 NOTE — BH Assessment (Signed)
Assessment Note   Thomas Benitez is an 27 y.o. male. Patient presents requesting detox. He states that he has gone through detox at least 10-15 times and feels that he needs to keep trying until he gets it right. Patient also states that his father had a massive stroke over the summer of last year and he feels he needs to get it together so he can care for his father as well.  He last went through detox 4 months ago at [facility]. He is scheduled to go to [facility] on April 10th. He admits to a history of blackouts but denies any seizures or delirium tremors.  Patient appears to really want help and wants to be ready to go into rehab.  Axis I: Polysubstance Dependence Axis II: Deferred Axis III:  Past Medical History  Diagnosis Date  . Substance abuse    Axis IV: economic problems, occupational problems, other psychosocial or environmental problems, problems related to social environment and problems with primary support group Axis V: 40  Past Medical History:  Past Medical History  Diagnosis Date  . Substance abuse     History reviewed. No pertinent past surgical history.  Family History: No family history on file.  Social History:  reports that he has been smoking.  He has never used smokeless tobacco. He reports that  drinks alcohol. He reports that he uses illicit drugs (Marijuana and Cocaine).  Additional Social History:  Alcohol / Drug Use History of alcohol / drug use?: Yes Substance #1 Name of Substance 1: Alcohol 1 - Age of First Use: 11 1 - Amount (size/oz): (4) 40oz beers 1 - Frequency: Daily 1 - Duration: 3 months 1 - Last Use / Amount: yesterday/ 1 (12 oz) beer Substance #2 Name of Substance 2: Opiates 2 - Age of First Use: 19 2 - Amount (size/oz): "whatever I can get" 2 - Frequency: Daily 2 - Duration: 3 months 2 - Last Use / Amount: 2 days ago/ 1 pill Substance #3 Name of Substance 3: THC 3 - Age of First Use: 12 3 - Amount (size/oz): 1 blunt 3 -  Frequency: Daily 3 - Duration: 3 months 3 - Last Use / Amount: yesterday/ 1 joint Substance #4 Name of Substance 4: Cocaine 4 - Age of First Use: 14 4 - Amount (size/oz): Varies 4 - Frequency: 3x/ week 4 - Duration: 3 months 4 - Last Use / Amount: 2 days ago/ .5 grams  CIWA: CIWA-Ar BP: 125/86 mmHg Pulse Rate: 72 Nausea and Vomiting: no nausea and no vomiting Tactile Disturbances: none Tremor: no tremor Auditory Disturbances: not present Paroxysmal Sweats: no sweat visible Visual Disturbances: not present Anxiety: moderately anxious, or guarded, so anxiety is inferred Headache, Fullness in Head: none present Agitation: normal activity Orientation and Clouding of Sensorium: oriented and can do serial additions CIWA-Ar Total: 4 COWS: Clinical Opiate Withdrawal Scale (COWS) Resting Pulse Rate: Pulse Rate 80 or below Sweating: No report of chills or flushing Restlessness: Reports difficulty sitting still, but is able to do so Pupil Size: Pupils pinned or normal size for room light Bone or Joint Aches: Not present Runny Nose or Tearing: Not present GI Upset: No GI symptoms Tremor: No tremor Yawning: No yawning Anxiety or Irritability: Patient obviously irritable/anxious Gooseflesh Skin: Skin is smooth COWS Total Score: 3  Allergies:  Allergies  Allergen Reactions  . Shellfish Allergy Swelling    Tongue and lip swelling    Home Medications:  (Not in a hospital admission)  OB/GYN Status:  No LMP for male patient.  General Assessment Data Location of Assessment: [facility] ED ACT Assessment: Yes Living Arrangements: Alone Can pt return to current living arrangement?: Yes Admission Status: Voluntary Is patient capable of signing voluntary admission?: Yes Transfer from: Home Referral Source: MD  Education Status Is patient currently in school?: No Current Grade:  (Na) Highest grade of school patient has completed: 9th Name of school:  ([employer] in [place]) Solicitor person:   (Elver Nicolls/ father)  Risk to self Suicidal Ideation: No Suicidal Intent: No Is patient at risk for suicide?: No Suicidal Plan?: No Access to Means: No What has been your use of drugs/alcohol within the last 12 months?:  (Daily) Previous Attempts/Gestures: Yes How many times?:  (1x/ OD'd 2 years ago) Other Self Harm Risks:  (None noted) Triggers for Past Attempts: Unpredictable Intentional Self Injurious Behavior: None Family Suicide History: No Recent stressful life event(s): Other (Comment) (None noted) Persecutory voices/beliefs?: No Depression: Yes Depression Symptoms: Insomnia;Guilt Substance abuse history and/or treatment for substance abuse?: Yes (4 months ago at [facility]) Suicide prevention information given to non-admitted patients: Yes  Risk to Others Homicidal Ideation: No Thoughts of Harm to Others: No Current Homicidal Intent: No Current Homicidal Plan: No Access to Homicidal Means: No Identified Victim:  (Na) History of harm to others?: No Assessment of Violence: None Noted Violent Behavior Description:  (Na) Does patient have access to weapons?: No Criminal Charges Pending?: No Does patient have a court date: No  Psychosis Hallucinations: None noted Delusions: None noted  Mental Status Report Appear/Hygiene: Other (Comment) (WNL) Eye Contact: Good Motor Activity: Freedom of movement;Unremarkable Speech: Logical/coherent Level of Consciousness: Alert;Irritable Mood: Depressed;Anxious Affect: Anxious;Appropriate to circumstance Anxiety Level: Moderate Thought Processes: Coherent;Relevant Judgement: Unimpaired Orientation: Person;Place;Time;Situation Obsessive Compulsive Thoughts/Behaviors: None  Cognitive Functioning Concentration: Decreased Memory: Recent Intact;Remote Intact IQ: Average Insight: Fair Impulse Control: Fair Appetite: Good Weight Loss:  (15 lbs in past 4 months) Weight Gain:  (Na) Sleep: No Change Total Hours of Sleep:   (Can't sleep without using) Vegetative Symptoms: None  ADLScreening [facility]) Patient's cognitive ability adequate to safely complete daily activities?: Yes Patient able to express need for assistance with ADLs?: Yes Independently performs ADLs?: Yes (appropriate for developmental age)  Abuse/Neglect [facility]) Physical Abuse: Denies Verbal Abuse: Denies Sexual Abuse: Denies  Prior Inpatient Therapy Prior Inpatient Therapy: Yes Prior Therapy Dates:  (4 months ago) Prior Therapy Facilty/Provider(s): [facility] Reason for Treatment: etoh   Prior Outpatient Therapy Prior Outpatient Therapy: No Prior Therapy Dates: n/a Prior Therapy Facilty/Provider(s): n/a Reason for Treatment: n/a  ADL Screening (condition at time of admission) Patient's cognitive ability adequate to safely complete daily activities?: Yes Patient able to express need for assistance with ADLs?: Yes Independently performs ADLs?: Yes (appropriate for developmental age) Weakness of Legs: None Weakness of Arms/Hands: None       Abuse/Neglect Assessment (Assessment to be complete while patient is alone) Physical Abuse: Denies Verbal Abuse: Denies Sexual Abuse: Denies Exploitation of patient/patient's resources: Denies Self-Neglect: Denies Values / Beliefs Cultural Requests During Hospitalization: None Spiritual Requests During Hospitalization: None   Advance Directives (For Healthcare) Advance Directive: Patient does not have advance directive;Patient would not like information    Additional Information 1:1 In Past 12 Months?: No CIRT Risk: No Elopement Risk: No Does patient have medical clearance?: Yes     Disposition:  Disposition Initial Assessment Completed for this Encounter: Yes Disposition of Patient: Inpatient treatment program Type of inpatient treatment program: Adult  On  Site Evaluation by:   Reviewed with Physician:     Rudi Coco 04/06/2012 9:57 PM

## 2012-04-06 NOTE — ED Notes (Signed)
Pt transferred to rm 29. Alert and oriented. Requested a blanket and pillow. Items given to pt. Pt refused to discuss situation leading to admission. rsting comfortably at this time. Vwilliams,rn.

## 2012-04-06 NOTE — ED Provider Notes (Signed)
Patient was offered outpatient detox resources.  At time of discharge he stated he was suicidal and if discharge is going to kill himself. Psychiatric consultation was requested 6:30 AM Dr. Jacky Kindle, psychiatrist, recommends IVC, admit to psych unit and withdrawal medications  Sunnie Nielsen, MD 04/06/12 [phone]

## 2012-04-07 MED ORDER — NICOTINE 21 MG/24HR TD PT24
21.0000 mg | MEDICATED_PATCH | Freq: Every day | TRANSDERMAL | Status: DC
  Administered 2012-04-07 – 2012-04-08 (×2): 21 mg via TRANSDERMAL
  Filled 2012-04-07: qty 1

## 2012-04-07 NOTE — ED Notes (Signed)
Up to the desk for a snack, nad, watching tv

## 2012-04-07 NOTE — ED Notes (Signed)
Pt pleasant and cooperative; no s/s of distress noted at this time.

## 2012-04-07 NOTE — ED Notes (Signed)
Up tot he bathroom to shower and change scrubs 

## 2012-04-07 NOTE — ED Notes (Signed)
Pt approaching desk requesting anxiety medication. Pt appearing anxious but with no s/s of distress.

## 2012-04-07 NOTE — ED Notes (Signed)
Up to the bathroom 

## 2012-04-07 NOTE — ED Notes (Signed)
Belongings in locker 29 

## 2012-04-07 NOTE — ED Provider Notes (Signed)
Resting. No distress. Awaiting placement.   BP 129/83  Pulse 52  Temp(Src) 97.4 F (36.3 C) (Oral)  Resp 18  Wt 161 lb (73.029 kg)  SpO2 97%   Glynn Octave, MD 04/07/12 [phone]

## 2012-04-08 DIAGNOSIS — F1994 Other psychoactive substance use, unspecified with psychoactive substance-induced mood disorder: Secondary | ICD-10-CM

## 2012-04-08 DIAGNOSIS — F1910 Other psychoactive substance abuse, uncomplicated: Secondary | ICD-10-CM

## 2012-04-08 NOTE — BHH Suicide Risk Assessment (Signed)
Suicide Risk Assessment  Discharge Assessment     Demographic Factors:  Male, Adolescent or young adult, Caucasian and Low socioeconomic status  Mental Status Per Nursing Assessment::   On Admission:     Current Mental Status by Physician: NA  Loss Factors: Financial problems/change in socioeconomic status  Historical Factors: Impulsivity  Risk Reduction Factors:   Sense of responsibility to family, Religious beliefs about death, Employed, Living with another person, especially a relative, Positive social support and Positive therapeutic relationship  Continued Clinical Symptoms:  Alcohol/Substance Abuse/Dependencies  Cognitive Features That Contribute To Risk:  Polarized thinking    Suicide Risk:  Minimal: No identifiable suicidal ideation.  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 Substance abuse    AXIS IV:  economic problems, other psychosocial or environmental problems and problems related to social environment AXIS V:  41-50 serious symptoms  Plan Of Care/Follow-up recommendations:  Activity:  as tolerated Diet:  regular  Is patient on multiple antipsychotic therapies at discharge:  No   Has Patient had three or more failed trials of antipsychotic monotherapy by history:  No  Recommended Plan for Multiple Antipsychotic Therapies: Not applicable   Eliav Mechling,JANARDHAHA R. 04/08/2012, 12:36 PM

## 2012-04-08 NOTE — BHH Counselor (Signed)
Patient does not meet criteria for detox at this time. Patient discharged home to follow up with [facility] 04/11/2012. Patient given several additional referrals to [facility], [facility], AA, and mobile crises to use as needed.

## 2012-04-08 NOTE — ED Provider Notes (Signed)
Patient is currently medically stable. Awaiting psychiatric placement.  Filed Vitals:   04/08/12 0649  BP: 109/68  Pulse: 66  Temp: 97.9 F (36.6 C)  Resp: 18     Celene Kras, MD 04/08/12 [phone]

## 2012-04-08 NOTE — Consult Note (Signed)
Reason for Consult: Substance induced mood disorder and polysubstance abuse Referring Physician: Dr. Corie Benitez is an 27 y.o. male.  HPI: Patient was seen and chart reviewed. Patient came to the [facility] long emergency department requesting detox and rehab services. Patient reportedly has been using drugs since he was 27 years old while living in [place], [place] and relocated to [place] from [place] about 2 years ago. His mother lives in [place]. Patient has been in and out of the rehabilitation center since last 2 years. Reportedly he will be sober for 2 months each time and then get back to his drug of abuse. Patient reportedly uses opiates, Alcohol, cocaine, and marijuana. His UDS is  Positive for cocaine and marijuana, but his blood alcohol level is less than significant and opiates not positive time. Patient stated he was waiting to receive rehabilitation services again, because he is tired off living his life. He was not able to support his 65 months old child and child's mother because he is using all his money to support drug abuse.  MSE: Patient has a tattoo on his Bicitra and both forearms, of his uncle's name and Daughter name. He was awake, alert, oriented to time, place, person and situation. Patient stated mood is fine his affect was appropriate, bright and full. He has normal rate, rhythm and volume of speech. His thought process linear and goal-directed. He has no suicidal onset ideation or evidence of psychotic symptoms.  Past Medical History  Diagnosis Date  . Substance abuse     History reviewed. No pertinent past surgical history.  No family history on file.  Social History:  reports that he has been smoking.  He has never used smokeless tobacco. He reports that  drinks alcohol. He reports that he uses illicit drugs (Marijuana and Cocaine).  Allergies:  Allergies  Allergen Reactions  . Shellfish Allergy Swelling    Tongue and lip swelling     Medications: I have reviewed the patient's current medications.  No results found for this or any previous visit (from the past 48 hour(s)).  No results found.  No depression Blood pressure 128/79, pulse 68, temperature 98.7 F (37.1 C), temperature source Oral, resp. rate 16, weight 161 lb (73.029 kg), SpO2 98.00%.   Assessment/Plan: Polysubstance abuse versus dependence Substance-induced mood disorder  Recommendation: Patient will be referred to the inpatient substance abuse rehabilitation center at the [facility].  Patient does not require the psychiatric medication.   Thomas Benitez,Thomas R. 04/08/2012, 12:22 PM

## 2012-05-02 ENCOUNTER — Emergency Department (HOSPITAL_BASED_OUTPATIENT_CLINIC_OR_DEPARTMENT_OTHER)
Admission: EM | Admit: 2012-05-02 | Discharge: 2012-05-02 | Disposition: A | Discharge status: Home / Self Care | Payer: Medicaid - Out of State | Attending: Emergency Medicine | Admitting: Emergency Medicine

## 2012-05-02 ENCOUNTER — Encounter (HOSPITAL_BASED_OUTPATIENT_CLINIC_OR_DEPARTMENT_OTHER): Payer: Self-pay

## 2012-05-02 DIAGNOSIS — R0982 Postnasal drip: Secondary | ICD-10-CM | POA: Insufficient documentation

## 2012-05-02 DIAGNOSIS — J069 Acute upper respiratory infection, unspecified: Secondary | ICD-10-CM | POA: Insufficient documentation

## 2012-05-02 DIAGNOSIS — K029 Dental caries, unspecified: Secondary | ICD-10-CM | POA: Insufficient documentation

## 2012-05-02 DIAGNOSIS — R059 Cough, unspecified: Secondary | ICD-10-CM | POA: Insufficient documentation

## 2012-05-02 DIAGNOSIS — R6889 Other general symptoms and signs: Secondary | ICD-10-CM | POA: Insufficient documentation

## 2012-05-02 DIAGNOSIS — R05 Cough: Secondary | ICD-10-CM | POA: Insufficient documentation

## 2012-05-02 DIAGNOSIS — F17200 Nicotine dependence, unspecified, uncomplicated: Secondary | ICD-10-CM | POA: Insufficient documentation

## 2012-05-02 DIAGNOSIS — J3489 Other specified disorders of nose and nasal sinuses: Secondary | ICD-10-CM | POA: Insufficient documentation

## 2012-05-02 MED ORDER — FEXOFENADINE HCL 60 MG PO TABS: 60.0000 mg | ORAL_TABLET | Freq: Two times a day (BID) | ORAL | Status: DC

## 2012-05-02 NOTE — ED Provider Notes (Signed)
History     CSN: [id number]  Arrival date & time 05/02/12  4098   First MD Initiated Contact with Patient 05/02/12 1103      Chief Complaint  Patient presents with  . Dental Pain  . Nasal Congestion    (Consider location/radiation/quality/duration/timing/severity/associated sxs/prior treatment) HPI Comments: Patient presents with runny nose and nasal congestion for the last 3 days. He's also had some postnasal drip and cough. He denies any chest congestion or shortness of breath. He denies any fevers or chills. He denies any facial pain. He's been taking Mucinex without relief. He also says that 3 days ago his left back tooth which is decayed,  partially fell out. He denies any pain or swelling around the tooth. He denies any drainage from the tooth. He is currently at [facility]  Patient is a 27 y.o. male presenting with tooth pain.  Dental PainThe primary symptoms include cough. Primary symptoms do not include headaches, fever, shortness of breath or sore throat.  Additional symptoms do not include: trouble swallowing and fatigue.    Past Medical History  Diagnosis Date  . Substance abuse     History reviewed. No pertinent past surgical history.  No family history on file.  History  Substance Use Topics  . Smoking status: Current Every Day Smoker -- 1.00 packs/day    Types: Cigarettes  . Smokeless tobacco: Never Used  . Alcohol Use: Yes     Comment: 4 40's a day last drink 7 weeks ago      Review of Systems  Constitutional: Negative for fever, chills, diaphoresis and fatigue.  HENT: Positive for congestion, rhinorrhea, sneezing, dental problem and postnasal drip. Negative for sore throat, mouth sores, trouble swallowing and sinus pressure.   Eyes: Negative.   Respiratory: Positive for cough. Negative for chest tightness and shortness of breath.   Cardiovascular: Negative for chest pain and leg swelling.  Gastrointestinal: Negative for nausea, vomiting,  abdominal pain, diarrhea and blood in stool.  Genitourinary: Negative for frequency, hematuria, flank pain and difficulty urinating.  Musculoskeletal: Negative for back pain and arthralgias.  Skin: Negative for rash.  Neurological: Negative for dizziness, speech difficulty, weakness, numbness and headaches.    Allergies  Shellfish allergy  Home Medications   Current Outpatient Rx  Name  Route  Sig  Dispense  Refill  . fexofenadine (ALLEGRA) 60 MG tablet   Oral   Take 1 tablet (60 mg total) by mouth 2 (two) times daily.   30 tablet   0     BP 148/100  Pulse 64  Temp(Src) 98 F (36.7 C) (Oral)  Resp 20  Ht 5\' 8"  (1.727 m)  Wt 165 lb (74.844 kg)  BMI 25.09 kg/m2  SpO2 96%  Physical Exam  Constitutional: He is oriented to person, place, and time. He appears well-developed and well-nourished.  HENT:  Head: Normocephalic and atraumatic.  Right Ear: External ear normal.  Left Ear: External ear normal.  Patient has marked dental decay. He has a decayed and partially eroded tooth involving the left upper back molar. There is no  redness or swelling around the area. There's no induration or fluctuance. There is no drainage around the area  Eyes: Pupils are equal, round, and reactive to light.  Neck: Normal range of motion. Neck supple.  Cardiovascular: Normal rate, regular rhythm and normal heart sounds.   Pulmonary/Chest: Effort normal and breath sounds normal. No respiratory distress. He has no wheezes. He has no rales. He exhibits no  tenderness.  Abdominal: Soft. Bowel sounds are normal. There is no tenderness. There is no rebound and no guarding.  Musculoskeletal: Normal range of motion. He exhibits no edema.  Lymphadenopathy:    He has no cervical adenopathy.  Neurological: He is alert and oriented to person, place, and time.  Skin: Skin is warm and dry. No rash noted.  Psychiatric: He has a normal mood and affect.    ED Course  Procedures (including critical care  time)  Labs Reviewed - No data to display No results found.   1. Dental caries   2. URI (upper respiratory infection)       MDM  Patient was started on Allegra for his cold symptoms. He was given a referral to the [facility]. His tooth does not appear to be infected so I did not give him antibiotics.        Rolan Bucco, MD 05/02/12 1120

## 2012-05-02 NOTE — ED Notes (Signed)
Pt reports nasal congestion and dental pain x 3 days unrelieved after taking Mucinex.

## 2013-06-15 ENCOUNTER — Emergency Department: Payer: Self-pay | Admitting: Emergency Medicine

## 2013-09-04 ENCOUNTER — Emergency Department: Payer: Self-pay | Admitting: Emergency Medicine

## 2014-03-24 DIAGNOSIS — B1920 Unspecified viral hepatitis C without hepatic coma: Secondary | ICD-10-CM | POA: Insufficient documentation

## 2014-04-07 DIAGNOSIS — F329 Major depressive disorder, single episode, unspecified: Secondary | ICD-10-CM | POA: Insufficient documentation

## 2014-04-07 DIAGNOSIS — F32A Depression, unspecified: Secondary | ICD-10-CM | POA: Insufficient documentation

## 2014-04-07 LAB — CBC AND DIFFERENTIAL
Neutrophils Absolute: 7 /uL
WBC: 10 10*3/mL

## 2014-04-07 LAB — TSH: TSH: 1.66 u[IU]/mL (ref 0.41–5.90)

## 2014-04-07 LAB — BASIC METABOLIC PANEL WITH GFR
BUN: 17 mg/dL (ref 4–21)
Glucose: 86 mg/dL
Sodium: 139 mmol/L (ref 137–147)

## 2014-05-03 ENCOUNTER — Encounter: Payer: Self-pay | Admitting: *Deleted

## 2014-05-03 ENCOUNTER — Emergency Department
Admission: EM | Admit: 2014-05-03 | Discharge: 2014-05-03 | Disposition: A | Discharge status: Home / Self Care | Payer: Medicaid - Out of State | Attending: Emergency Medicine | Admitting: Emergency Medicine

## 2014-05-03 ENCOUNTER — Emergency Department: Payer: Medicaid - Out of State

## 2014-05-03 DIAGNOSIS — Z720 Tobacco use: Secondary | ICD-10-CM | POA: Insufficient documentation

## 2014-05-03 DIAGNOSIS — M545 Low back pain, unspecified: Secondary | ICD-10-CM

## 2014-05-03 DIAGNOSIS — Z79899 Other long term (current) drug therapy: Secondary | ICD-10-CM | POA: Diagnosis not present

## 2014-05-03 DIAGNOSIS — R1031 Right lower quadrant pain: Secondary | ICD-10-CM | POA: Insufficient documentation

## 2014-05-03 HISTORY — DX: Unspecified viral hepatitis C without hepatic coma: B19.20

## 2014-05-03 LAB — COMPREHENSIVE METABOLIC PANEL
ALBUMIN: 4.7 g/dL (ref 3.5–5.0)
ALK PHOS: 50 U/L (ref 38–126)
ALT: 38 U/L (ref 17–63)
AST: 26 U/L (ref 15–41)
Anion gap: 9 (ref 5–15)
BILIRUBIN TOTAL: 1.1 mg/dL (ref 0.3–1.2)
BUN: 11 mg/dL (ref 6–20)
CO2: 27 mmol/L (ref 22–32)
Calcium: 9.6 mg/dL (ref 8.9–10.3)
Chloride: 103 mmol/L (ref 101–111)
Creatinine, Ser: 0.84 mg/dL (ref 0.61–1.24)
GFR calc Af Amer: >60 mL/min (ref >60)
GFR calc non Af Amer: >60 mL/min (ref >60)
GLUCOSE: 116 mg/dL — AB (ref 65–99)
POTASSIUM: 4.3 mmol/L (ref 3.5–5.1)
SODIUM: 139 mmol/L (ref 135–145)
Total Protein: 8.5 g/dL — ABNORMAL HIGH (ref 6.5–8.1)

## 2014-05-03 LAB — URINALYSIS COMPLETE WITH MICROSCOPIC (ARMC ONLY)
BACTERIA UA: NONE SEEN
Bilirubin Urine: NEGATIVE
Glucose, UA: NEGATIVE mg/dL
HGB URINE DIPSTICK: NEGATIVE
Leukocytes, UA: NEGATIVE
NITRITE: NEGATIVE
PROTEIN: NEGATIVE mg/dL
SPECIFIC GRAVITY, URINE: 1.02 (ref 1.005–1.030)
Squamous Epithelial / LPF: NONE SEEN
pH: 5 (ref 5.0–8.0)

## 2014-05-03 LAB — CBC WITH DIFFERENTIAL/PLATELET
BASOS ABS: 0.1 10*3/uL (ref 0–0.1)
EOS ABS: 0.1 10*3/uL (ref 0–0.7)
Eosinophils Relative: 1 %
HCT: 48 % (ref 40.0–52.0)
HEMOGLOBIN: 16.6 g/dL (ref 13.0–18.0)
Lymphocytes Relative: 17 %
Lymphs Abs: 1.5 10*3/uL (ref 1.0–3.6)
MCH: 30.5 pg (ref 26.0–34.0)
MCHC: 34.5 g/dL (ref 32.0–36.0)
MCV: 88.2 fL (ref 80.0–100.0)
Monocytes Absolute: 0.5 10*3/uL (ref 0.2–1.0)
NEUTROS ABS: 6.5 10*3/uL (ref 1.4–6.5)
Platelets: 292 10*3/uL (ref 150–440)
RBC: 5.45 MIL/uL (ref 4.40–5.90)
RDW: 13 % (ref 11.5–14.5)
WBC: 8.6 10*3/uL (ref 3.8–10.6)

## 2014-05-03 MED ORDER — MORPHINE SULFATE 4 MG/ML IJ SOLN
INTRAMUSCULAR | Status: AC
  Filled 2014-05-03: qty 1

## 2014-05-03 MED ORDER — ONDANSETRON HCL 4 MG/2ML IJ SOLN
INTRAMUSCULAR | Status: AC
  Filled 2014-05-03: qty 2

## 2014-05-03 MED ORDER — MORPHINE SULFATE 4 MG/ML IJ SOLN
4.0000 mg | Freq: Once | INTRAMUSCULAR | Status: AC
  Administered 2014-05-03: 4 mg via INTRAVENOUS

## 2014-05-03 MED ORDER — ONDANSETRON HCL 4 MG/2ML IJ SOLN
4.0000 mg | Freq: Once | INTRAMUSCULAR | Status: AC
  Administered 2014-05-03: 4 mg via INTRAVENOUS

## 2014-05-03 MED ORDER — SODIUM CHLORIDE 0.9 % IV BOLUS (SEPSIS)
1000.0000 mL | Freq: Once | INTRAVENOUS | Status: AC
  Administered 2014-05-03: 1000 mL via INTRAVENOUS

## 2014-05-03 NOTE — ED Provider Notes (Signed)
[facility] Emergency Department Provider Note    ____________________________________________  Time seen: 5:10 PM  I have reviewed the triage vital signs and the nursing notes.   HISTORY  Chief Complaint Flank Pain       HPI Kreg ShropshireRichard Troupe is a 29 y.o. male who presents with 4 days of worsening bilateral sharp low back pain which is radiating to the groin. The pain is intermittent and severe at its worst. It is moderate at this time. Patient is not on any history of kidney stones or family history of kidney stones. Nothing makes it better or worse. Patient is also reporting some burning with urination.Patient denies any associated nausea or vomiting.    Past Medical History  Diagnosis Date  . Substance abuse   . Hepatitis C     Patient Active Problem List   Diagnosis Date Noted  . Opiate dependence, continuous 03/04/2012  . Alcohol dependence 03/04/2012  . Cannabis abuse, daily use 03/04/2012    History reviewed. No pertinent past surgical history.  Current Outpatient Rx  Name  Route  Sig  Dispense  Refill  . fexofenadine (ALLEGRA) 60 MG tablet   Oral   Take 1 tablet (60 mg total) by mouth 2 (two) times daily.   30 tablet   0     Allergies Review of patient's allergies indicates no known allergies.  History reviewed. No pertinent family history.  Social History History  Substance Use Topics  . Smoking status: Current Every Day Smoker -- 1.00 packs/day    Types: Cigarettes  . Smokeless tobacco: Never Used  . Alcohol Use: Yes     Comment: 4 40's a day last drink 7 weeks ago    Review of Systems  Constitutional: Negative for fever. Eyes: Negative for visual changes. ENT: Negative for sore throat. Cardiovascular: Negative for chest pain. Respiratory: Negative for shortness of breath. Gastrointestinal: Lower abdominal pain bilaterally radiating to the groin.  Genitourinary: Burning with urination.  Musculoskeletal:  Bilateral lumbar low back pain. Skin: Negative for rash. Neurological: Negative for headaches, focal weakness or numbness.   10-point ROS otherwise negative.  ____________________________________________   PHYSICAL EXAM:  VITAL SIGNS: ED Triage Vitals  Enc Vitals Group     BP 05/03/14 1610 129/88 mmHg     Pulse Rate 05/03/14 1610 83     Resp --      Temp 05/03/14 1610 98.5 F (36.9 C)     Temp Source 05/03/14 1610 Oral     SpO2 05/03/14 1610 99 %     Weight 05/03/14 1610 150 lb (68.04 kg)     Height 05/03/14 1610 5\' 8"  (1.727 m)     Head Cir --      Peak Flow --      Pain Score 05/03/14 1610 10     Pain Loc --      Pain Edu? --      Excl. in GC? --      Constitutional: Alert and oriented. Well appearing and in no distress. Eyes: Conjunctivae are normal. PERRL. Normal extraocular movements. ENT   Head: Normocephalic and atraumatic.   Nose: No congestion/rhinnorhea.   Mouth/Throat: Mucous membranes are moist.   Neck: No stridor. Hematological/Lymphatic/Immunilogical: No cervical lymphadenopathy. Cardiovascular: Normal rate, regular rhythm. Normal and symmetric distal pulses are present in all extremities. No murmurs, rubs, or gallops. Respiratory: Normal respiratory effort without tachypnea nor retractions. Breath sounds are clear and equal bilaterally. No wheezes/rales/rhonchi. Gastrointestinal: Soft and nontender. No distention. No  abdominal bruits. There is no CVA tenderness. Genitourinary: Circumcised male with normal genital exam. There is no testicular enlargement or tenderness palpation. Musculoskeletal: Nontender with normal range of motion in all extremities. No joint effusions.  No lower extremity tenderness nor edema.  There is no CVA tenderness palpation or tenderness of low back with the patient is reporting pain. Neurologic:  Normal speech and language. No gross focal neurologic deficits are appreciated. Speech is normal. No gait  instability. Skin:  Skin is warm, dry and intact. No rash noted. Psychiatric: Mood and affect are normal. Speech and behavior are normal. Patient exhibits appropriate insight and judgment.  ____________________________________________   EKG    ____________________________________________    RADIOLOGY  CT abdomen and pelvis NAD.  ____________________________________________     ____________________________________________   INITIAL IMPRESSION / ASSESSMENT AND PLAN / ED COURSE  Pertinent labs & imaging results that were available during my care of the patient were reviewed by me and considered in my medical decision making (see chart for details).  At 720 the patient is resting comfortably in his bed. There are no signs of distress. His vital signs are reassuring as well as his workup. Questionable musculoskeletal pain versus narcotic seeking behavior. The patient did ask for several percent upon discharge but I explained to him that I could not write these for him given that there were no acute findings on his workup today. The patient understood. He says [facility]  ____________________________________________   FINAL CLINICAL IMPRESSION(S) / ED DIAGNOSES  Back and flank pain. Acute, initial visit.  Arelia Longest, PA-C 05/03/14 1944

## 2014-05-03 NOTE — Discharge Instructions (Signed)
Back Pain, Adult Low back pain is very common. About 1 in 5 people have back pain.The cause of low back pain is rarely dangerous. The pain often gets better over time.About half of people with a sudden onset of back pain feel better in just 2 weeks. About 8 in 10 people feel better by 6 weeks.  CAUSES Some common causes of back pain include:  Strain of the muscles or ligaments supporting the spine.  Wear and tear (degeneration) of the spinal discs.  Arthritis.  Direct injury to the back. DIAGNOSIS Most of the time, the direct cause of low back pain is not known.However, back pain can be treated effectively even when the exact cause of the pain is unknown.Answering your caregiver's questions about your overall health and symptoms is one of the most accurate ways to make sure the cause of your pain is not dangerous. If your caregiver needs more information, he or she may order lab work or imaging tests (X-rays or MRIs).However, even if imaging tests show changes in your back, this usually does not require surgery. HOME CARE INSTRUCTIONS For many people, back pain returns.Since low back pain is rarely dangerous, it is often a condition that people can learn to manageon their own.   Remain active. It is stressful on the back to sit or stand in one place. Do not sit, drive, or stand in one place for more than 30 minutes at a time. Take short walks on level surfaces as soon as pain allows.Try to increase the length of time you walk each day.  Do not stay in bed.Resting more than 1 or 2 days can delay your recovery.  Do not avoid exercise or work.Your body is made to move.It is not dangerous to be active, even though your back may hurt.Your back will likely heal faster if you return to being active before your pain is gone.  Pay attention to your body when you bend and lift. Many people have less discomfortwhen lifting if they bend their knees, keep the load close to their bodies,and  avoid twisting. Often, the most comfortable positions are those that put less stress on your recovering back.  Find a comfortable position to sleep. Use a firm mattress and lie on your side with your knees slightly bent. If you lie on your back, put a pillow under your knees.  Only take over-the-counter or prescription medicines as directed by your caregiver. Over-the-counter medicines to reduce pain and inflammation are often the most helpful.Your caregiver may prescribe muscle relaxant drugs.These medicines help dull your pain so you can more quickly return to your normal activities and healthy exercise.  Put ice on the injured area.  Put ice in a plastic bag.  Place a towel between your skin and the bag.  Leave the ice on for 15-20 minutes, 03-04 times a day for the first 2 to 3 days. After that, ice and heat may be alternated to reduce pain and spasms.  Ask your caregiver about trying back exercises and gentle massage. This may be of some benefit.  Avoid feeling anxious or stressed.Stress increases muscle tension and can worsen back pain.It is important to recognize when you are anxious or stressed and learn ways to manage it.Exercise is a great option. SEEK MEDICAL CARE IF:  You have pain that is not relieved with rest or medicine.  You have pain that does not improve in 1 week.  You have new symptoms.  You are generally not feeling well. SEEK   IMMEDIATE MEDICAL CARE IF:   You have pain that radiates from your back into your legs.  You develop new bowel or bladder control problems.  You have unusual weakness or numbness in your arms or legs.  You develop nausea or vomiting.  You develop abdominal pain.  You feel faint. Document Released: 12/19/2004 Document Revised: 06/20/2011 Document Reviewed: 04/22/2013 [employer] Patient Information 2015 [employer]. This information is not intended to replace advice given to you by your health care provider. Make sure you  discuss any questions you have with your health care provider.  

## 2014-05-03 NOTE — ED Notes (Signed)
Pt returned from ct at this time

## 2014-05-03 NOTE — ED Notes (Signed)
Pt reports bilateral flank pain with radiating around abdomen to groin x 4 days. Pt in obvious distress. Burning sensation.

## 2014-05-03 NOTE — ED Notes (Signed)
Pt to ct at this time.

## 2014-05-28 ENCOUNTER — Ambulatory Visit: Payer: Self-pay

## 2014-08-06 ENCOUNTER — Emergency Department
Admission: EM | Admit: 2014-08-06 | Discharge: 2014-08-06 | Disposition: A | Discharge status: Home / Self Care | Payer: Self-pay | Attending: Emergency Medicine | Admitting: Emergency Medicine

## 2014-08-06 ENCOUNTER — Emergency Department: Payer: Medicaid - Out of State

## 2014-08-06 ENCOUNTER — Encounter: Payer: Self-pay | Admitting: *Deleted

## 2014-08-06 ENCOUNTER — Emergency Department: Payer: Self-pay

## 2014-08-06 DIAGNOSIS — Y998 Other external cause status: Secondary | ICD-10-CM | POA: Insufficient documentation

## 2014-08-06 DIAGNOSIS — Y9389 Activity, other specified: Secondary | ICD-10-CM | POA: Insufficient documentation

## 2014-08-06 DIAGNOSIS — S00512A Abrasion of oral cavity, initial encounter: Secondary | ICD-10-CM | POA: Insufficient documentation

## 2014-08-06 DIAGNOSIS — Z720 Tobacco use: Secondary | ICD-10-CM | POA: Insufficient documentation

## 2014-08-06 DIAGNOSIS — R569 Unspecified convulsions: Secondary | ICD-10-CM | POA: Insufficient documentation

## 2014-08-06 DIAGNOSIS — Z79899 Other long term (current) drug therapy: Secondary | ICD-10-CM | POA: Insufficient documentation

## 2014-08-06 DIAGNOSIS — Y9289 Other specified places as the place of occurrence of the external cause: Secondary | ICD-10-CM | POA: Insufficient documentation

## 2014-08-06 DIAGNOSIS — X58XXXA Exposure to other specified factors, initial encounter: Secondary | ICD-10-CM | POA: Insufficient documentation

## 2014-08-06 HISTORY — DX: Unspecified convulsions: R56.9

## 2014-08-06 LAB — COMPREHENSIVE METABOLIC PANEL
ALBUMIN: 3.9 g/dL (ref 3.5–5.0)
ALT: 63 U/L (ref 17–63)
AST: 56 U/L — ABNORMAL HIGH (ref 15–41)
Alkaline Phosphatase: 52 U/L (ref 38–126)
Anion gap: 10 (ref 5–15)
BILIRUBIN TOTAL: 0.5 mg/dL (ref 0.3–1.2)
BUN: 9 mg/dL (ref 6–20)
CHLORIDE: 99 mmol/L — AB (ref 101–111)
CO2: 27 mmol/L (ref 22–32)
CREATININE: 0.82 mg/dL (ref 0.61–1.24)
Calcium: 9 mg/dL (ref 8.9–10.3)
GFR calc non Af Amer: >60 mL/min (ref >60)
GLUCOSE: 102 mg/dL — AB (ref 65–99)
POTASSIUM: 3.1 mmol/L — AB (ref 3.5–5.1)
SODIUM: 136 mmol/L (ref 135–145)
Total Protein: 7.4 g/dL (ref 6.5–8.1)

## 2014-08-06 LAB — CBC WITH DIFFERENTIAL/PLATELET
Basophils Absolute: 0.1 10*3/uL (ref 0–0.1)
Basophils Relative: 1 %
Eosinophils Absolute: 0.2 10*3/uL (ref 0–0.7)
Eosinophils Relative: 3 %
HCT: 41.3 % (ref 40.0–52.0)
Hemoglobin: 14.6 g/dL (ref 13.0–18.0)
Lymphocytes Relative: 30 %
Lymphs Abs: 2.4 10*3/uL (ref 1.0–3.6)
MCH: 30.3 pg (ref 26.0–34.0)
MCHC: 35.4 g/dL (ref 32.0–36.0)
MCV: 85.5 fL (ref 80.0–100.0)
Monocytes Absolute: 0.5 10*3/uL (ref 0.2–1.0)
Monocytes Relative: 7 %
Neutro Abs: 4.8 10*3/uL (ref 1.4–6.5)
Neutrophils Relative %: 59 %
Platelets: 223 10*3/uL (ref 150–440)
RBC: 4.83 MIL/uL (ref 4.40–5.90)
RDW: 12.8 % (ref 11.5–14.5)
WBC: 8.1 10*3/uL (ref 3.8–10.6)

## 2014-08-06 LAB — ETHANOL: Alcohol, Ethyl (B): <5 mg/dL (ref <5)

## 2014-08-06 LAB — TROPONIN I: Troponin I: <0.03 ng/mL (ref <0.031)

## 2014-08-06 MED ORDER — LORAZEPAM 2 MG/ML IJ SOLN: 1.0000 mg | Freq: Once | INTRAMUSCULAR | Status: DC

## 2014-08-06 MED ORDER — POTASSIUM CHLORIDE 20 MEQ/15ML (10%) PO SOLN
40.0000 meq | Freq: Once | ORAL | Status: AC
  Administered 2014-08-06: 40 meq via ORAL
  Filled 2014-08-06: qty 30

## 2014-08-06 NOTE — ED Notes (Signed)
MD Malinda at bedside. 

## 2014-08-06 NOTE — Discharge Instructions (Signed)
Seizure, Adult A seizure is abnormal electrical activity in the brain. Seizures usually last from 30 seconds to 2 minutes. There are various types of seizures. Before a seizure, you may have a warning sensation (aura) that a seizure is about to occur. An aura may include the following symptoms:   Fear or anxiety.  Nausea.  Feeling like the room is spinning (vertigo).  Vision changes, such as seeing flashing lights or spots. Common symptoms during a seizure include:  A change in attention or behavior (altered mental status).  Convulsions with rhythmic jerking movements.  Drooling.  Rapid eye movements.  Grunting.  Loss of bladder and bowel control.  Bitter taste in the mouth.  Tongue biting. After a seizure, you may feel confused and sleepy. You may also have an injury resulting from convulsions during the seizure. HOME CARE INSTRUCTIONS   If you are given medicines, take them exactly as prescribed by your health care provider.  Keep all follow-up appointments as directed by your health care provider.  Do not swim or drive or engage in risky activity during which a seizure could cause further injury to you or others until your health care provider says it is OK.  Get adequate rest.  Teach friends and family what to do if you have a seizure. They should:  Lay you on the ground to prevent a fall.  Put a cushion under your head.  Loosen any tight clothing around your neck.  Turn you on your side. If vomiting occurs, this helps keep your airway clear.  Stay with you until you recover.  Know whether or not you need emergency care. SEEK IMMEDIATE MEDICAL CARE IF:  The seizure lasts longer than 5 minutes.  The seizure is severe or you do not wake up immediately after the seizure.  You have an altered mental status after the seizure.  You are having more frequent or worsening seizures. Someone should drive you to the emergency department or call local emergency  services (911 in [place] MAKE SURE YOU:  Understand these instructions.  Will watch your condition.  Will get help right away if you are not doing well or get worse. Document Released: 12/17/1999 Document Revised: 10/09/2012 Document Reviewed: 07/31/2012 [facility] Patient Information 2015 [place], [place]. This information is not intended to replace advice given to you by your health care provider. Make sure you discuss any questions you have with your health care provider.  DO NOT DO DRUGS ANY MORE!!

## 2014-08-06 NOTE — ED Notes (Signed)
Pharmacy called regarding potassium, will send to ED.  

## 2014-08-06 NOTE — ED Notes (Addendum)
Pt to ED via EMS after possible seizure. Per EMS pt was found outside by father, pt states "the last thing I remember was being on the couch about 45 mins ago." Pt states "i don't know what happened, I don't remember" Pt is very anxious on arrival, states he has been an addict taking tramadol, percocet and xanax since he was 29 y.o. Pt states the last time he took anything was "ealier this morning." Pupils constricted on arrival. Pt AAOx3 at this time, repeating phrases and expressing to RN "how stressed out I have been" Pt appears in NAD at this time. Pt slightly hypertensive at 140/100 at this time.

## 2014-08-06 NOTE — ED Provider Notes (Signed)
[facility] Emergency Department Provider Note  ____________________________________________  Time seen: Approximately 3:58 PM  I have reviewed the triage vital signs and the nursing notes.   HISTORY  Chief Complaint Loss of Consciousness   HPI Thomas Benitez is a 29 y.o. male patient's father report he had a generalized tonic-clonic seizure today. This is his second seizure. Patient reports he has seizures when he does drugs opiates marijuana he's also done cocaine. He also did not sleep last night. This was these were the same same things that happen before his last seizure. Patient's father reported he was somewhat confused when he came out of the seizure. He is now awake alert talking making per family consents.  Past Medical History  Diagnosis Date  . Substance abuse   . Hepatitis C   . Seizures     pt states "i only had one before"     Patient Active Problem List   Diagnosis Date Noted  . Opiate dependence, continuous 03/04/2012  . Alcohol dependence 03/04/2012  . Cannabis abuse, daily use 03/04/2012    History reviewed. No pertinent past surgical history.  Current Outpatient Rx  Name  Route  Sig  Dispense  Refill  . fexofenadine (ALLEGRA) 60 MG tablet   Oral   Take 1 tablet (60 mg total) by mouth 2 (two) times daily.   30 tablet   0     Allergies Review of patient's allergies indicates no known allergies.  History reviewed. No pertinent family history.  Social History History  Substance Use Topics  . Smoking status: Current Every Day Smoker -- 1.00 packs/day    Types: Cigarettes  . Smokeless tobacco: Never Used  . Alcohol Use: No     Comment: 4 40's a day last drink 7 weeks ago    Review of Systems Constitutional: No fever/chills Eyes: No visual changes. ENT: No sore throat. Cardiovascular: Denies chest pain. Respiratory: Denies shortness of breath. Gastrointestinal: No abdominal pain.  No nausea, no vomiting.  No  diarrhea.  No constipation. Genitourinary: Negative for dysuria. Musculoskeletal: Negative for back pain. Skin: Negative for rash. Neurological: Negative for headaches, focal weakness or numbness.  10-point ROS otherwise negative.  ____________________________________________   PHYSICAL EXAM:  VITAL SIGNS: ED Triage Vitals  Enc Vitals Group     BP 08/06/14 1519 140/100 mmHg     Pulse Rate 08/06/14 1519 103     Resp 08/06/14 1519 20     Temp --      Temp src --      SpO2 08/06/14 1519 100 %     Weight 08/06/14 1519 155 lb (70.308 kg)     Height 08/06/14 1519 5\' 9"  (1.753 m)     Head Cir --      Peak Flow --      Pain Score --      Pain Loc --      Pain Edu? --      Excl. in GC? --     Constitutional: Alert and oriented. Well appearing and in no acute distress. Eyes: Conjunctivae are normal. PERRL. EOMI. fundi are normal Head: Atraumatic. Nose: No congestion/rhinnorhea. Mouth/Throat: Mucous membranes are moist.  Oropharynx non-erythematous. Patient did bite his tongue causing a small abrasion on the left lateral edge Neck: No stridor. \ Cardiovascular: Normal rate, regular rhythm. Grossly normal heart sounds.  Good peripheral circulation. Respiratory: Normal respiratory effort.  No retractions. Lungs CTAB. Gastrointestinal: Soft and nontender. No distention. No abdominal bruits. No CVA  tenderness. Musculoskeletal: No lower extremity tenderness nor edema.  No joint effusions. Neurologic:  Normal speech and language. No gross focal neurologic deficits are appreciated. No gait instability. Cranial nerves II through XII are intact finger-nose rapid alternating movements and hands are normal motor strength is 5 over 5 throughout and sensation is intact. Skin:  Skin is warm, dry and intact. No rash noted. Psychiatric: Mood and affect are normal. Speech and behavior are normal.  ____________________________________________   LABS (all labs ordered are listed, but only abnormal  results are displayed)  Labs Reviewed  COMPREHENSIVE METABOLIC PANEL - Abnormal; Notable for the following:    Potassium 3.1 (*)    Chloride 99 (*)    Glucose, Bld 102 (*)    AST 56 (*)    All other components within normal limits  ETHANOL  CBC WITH DIFFERENTIAL/PLATELET  TROPONIN I   ____________________________________________  EKG  EKG read and interpreted by me shows normal sinus rhythm at a rate of 79 normal axis normal EKG ____________________________________________  RADIOLOGY   ____________________________________________   PROCEDURES   ____________________________________________   INITIAL IMPRESSION / ASSESSMENT AND PLAN / ED COURSE  Pertinent labs & imaging results that were available during my care of the patient were reviewed by me and considered in my medical decision making (see chart for details).   ____________________________________________   FINAL CLINICAL IMPRESSION(S) / ED DIAGNOSES  Final diagnoses:  Seizure      Arnaldo Natal, MD 08/06/14 2200

## 2014-08-18 ENCOUNTER — Emergency Department (HOSPITAL_BASED_OUTPATIENT_CLINIC_OR_DEPARTMENT_OTHER)
Admission: EM | Admit: 2014-08-18 | Discharge: 2014-08-18 | Disposition: A | Discharge status: Home / Self Care | Payer: Self-pay | Attending: Emergency Medicine | Admitting: Emergency Medicine

## 2014-08-18 ENCOUNTER — Emergency Department (HOSPITAL_BASED_OUTPATIENT_CLINIC_OR_DEPARTMENT_OTHER): Payer: Self-pay

## 2014-08-18 ENCOUNTER — Encounter (HOSPITAL_BASED_OUTPATIENT_CLINIC_OR_DEPARTMENT_OTHER): Payer: Self-pay | Admitting: *Deleted

## 2014-08-18 DIAGNOSIS — Z8619 Personal history of other infectious and parasitic diseases: Secondary | ICD-10-CM | POA: Insufficient documentation

## 2014-08-18 DIAGNOSIS — F1210 Cannabis abuse, uncomplicated: Secondary | ICD-10-CM | POA: Insufficient documentation

## 2014-08-18 DIAGNOSIS — R109 Unspecified abdominal pain: Secondary | ICD-10-CM | POA: Insufficient documentation

## 2014-08-18 DIAGNOSIS — Z79899 Other long term (current) drug therapy: Secondary | ICD-10-CM | POA: Insufficient documentation

## 2014-08-18 DIAGNOSIS — F1410 Cocaine abuse, uncomplicated: Secondary | ICD-10-CM | POA: Insufficient documentation

## 2014-08-18 DIAGNOSIS — F1910 Other psychoactive substance abuse, uncomplicated: Secondary | ICD-10-CM

## 2014-08-18 DIAGNOSIS — Z720 Tobacco use: Secondary | ICD-10-CM | POA: Insufficient documentation

## 2014-08-18 DIAGNOSIS — R5383 Other fatigue: Secondary | ICD-10-CM | POA: Insufficient documentation

## 2014-08-18 DIAGNOSIS — F1310 Sedative, hypnotic or anxiolytic abuse, uncomplicated: Secondary | ICD-10-CM | POA: Insufficient documentation

## 2014-08-18 HISTORY — DX: Cocaine dependence, uncomplicated: F14.20

## 2014-08-18 LAB — COMPREHENSIVE METABOLIC PANEL
ALBUMIN: 3.9 g/dL (ref 3.5–5.0)
ALT: 61 U/L (ref 17–63)
AST: 41 U/L (ref 15–41)
Alkaline Phosphatase: 51 U/L (ref 38–126)
Anion gap: 8 (ref 5–15)
BUN: 14 mg/dL (ref 6–20)
CHLORIDE: 103 mmol/L (ref 101–111)
CO2: 27 mmol/L (ref 22–32)
Calcium: 9 mg/dL (ref 8.9–10.3)
Creatinine, Ser: 0.98 mg/dL (ref 0.61–1.24)
GFR calc Af Amer: >60 mL/min (ref >60)
GFR calc non Af Amer: >60 mL/min (ref >60)
GLUCOSE: 96 mg/dL (ref 65–99)
POTASSIUM: 3.7 mmol/L (ref 3.5–5.1)
SODIUM: 138 mmol/L (ref 135–145)
Total Bilirubin: 1 mg/dL (ref 0.3–1.2)
Total Protein: 7.6 g/dL (ref 6.5–8.1)

## 2014-08-18 LAB — CBC WITH DIFFERENTIAL/PLATELET
BASOS ABS: 0 10*3/uL (ref 0.0–0.1)
BASOS PCT: 0 % (ref 0–1)
EOS PCT: 3 % (ref 0–5)
Eosinophils Absolute: 0.2 10*3/uL (ref 0.0–0.7)
HCT: 40.6 % (ref 39.0–52.0)
Hemoglobin: 14.3 g/dL (ref 13.0–17.0)
Lymphocytes Relative: 31 % (ref 12–46)
Lymphs Abs: 1.8 10*3/uL (ref 0.7–4.0)
MCH: 30 pg (ref 26.0–34.0)
MCHC: 35.2 g/dL (ref 30.0–36.0)
MCV: 85.1 fL (ref 78.0–100.0)
MONO ABS: 0.8 10*3/uL (ref 0.1–1.0)
Monocytes Relative: 13 % — ABNORMAL HIGH (ref 3–12)
Neutro Abs: 3.3 10*3/uL (ref 1.7–7.7)
Neutrophils Relative %: 53 % (ref 43–77)
PLATELETS: 225 10*3/uL (ref 150–400)
RBC: 4.77 MIL/uL (ref 4.22–5.81)
RDW: 12.9 % (ref 11.5–15.5)
WBC: 6 10*3/uL (ref 4.0–10.5)

## 2014-08-18 LAB — RAPID URINE DRUG SCREEN, HOSP PERFORMED
AMPHETAMINES: NOT DETECTED
BARBITURATES: NOT DETECTED
BENZODIAZEPINES: POSITIVE — AB
Cocaine: POSITIVE — AB
Opiates: NOT DETECTED
Tetrahydrocannabinol: POSITIVE — AB

## 2014-08-18 LAB — URINALYSIS, ROUTINE W REFLEX MICROSCOPIC
Bilirubin Urine: NEGATIVE
Glucose, UA: NEGATIVE mg/dL
Hgb urine dipstick: NEGATIVE
KETONES UR: 15 mg/dL — AB
LEUKOCYTES UA: NEGATIVE
NITRITE: NEGATIVE
PH: 6 (ref 5.0–8.0)
Protein, ur: NEGATIVE mg/dL
SPECIFIC GRAVITY, URINE: 1.035 — AB (ref 1.005–1.030)
Urobilinogen, UA: 1 mg/dL (ref 0.0–1.0)

## 2014-08-18 LAB — ACETAMINOPHEN LEVEL

## 2014-08-18 LAB — ETHANOL

## 2014-08-18 NOTE — ED Notes (Signed)
Last cocaine intake was last PM, around midnight. Last marijuana 2 days ago.

## 2014-08-18 NOTE — ED Notes (Signed)
MD at bedside. 

## 2014-08-18 NOTE — ED Notes (Addendum)
His mother wants him to get into cocaine rehab. She took him to [facility] and they told him to come back tomorrow. Last used last night.

## 2014-08-18 NOTE — ED Notes (Signed)
C/o being tired all the time, pain in rt side. Poor appetite, doing a lot of cocaine, IV administration. States he has Hepatitis C. Very little alcohol intake. Utilizes marijuana, smokes every other day. Utilizing Xanax as well.

## 2014-08-18 NOTE — ED Provider Notes (Signed)
CSN: [id number]     Arrival date & time 08/18/14  1434 History   First MD Initiated Contact with Patient 08/18/14 1542     No chief complaint on file.    (Consider location/radiation/quality/duration/timing/severity/associated sxs/prior Treatment) The history is provided by the patient.   patient presents for medical clearance. He is here for polysubstance abuse. Reportedly was at day [facility] and has an appointment for tomorrow morning but was sent here for medical clearance. He was not informed what medical clearance would involve for them. States he's lost 20 pounds over last couple months. Has been doing about half gram of cocaine a day. He's been injecting it. Will use small amounts of benzos and opiates also states he would not withdrawal from them. Also small amounts alcohol. He has some underlying depression but not frank suicidal thoughts. No fevers or chills. No cough. States he has had some right-sided abdominal pain. He has a history of cirrhosis.  Past Medical History  Diagnosis Date  . Substance abuse   . Hepatitis C   . Seizures     pt states "i only had one before"   . Cocaine addiction    History reviewed. No pertinent past surgical history. No family history on file. Social History  Substance Use Topics  . Smoking status: Current Every Day Smoker -- 1.00 packs/day    Types: Cigarettes  . Smokeless tobacco: Never Used  . Alcohol Use: No     Comment: 4 40's a day last drink 7 weeks ago    Review of Systems  Constitutional: Positive for fatigue. Negative for activity change and appetite change.  Eyes: Negative for pain.  Respiratory: Negative for chest tightness and shortness of breath.   Cardiovascular: Negative for chest pain and leg swelling.  Gastrointestinal: Positive for abdominal pain. Negative for nausea, vomiting and diarrhea.  Genitourinary: Negative for flank pain.  Musculoskeletal: Negative for back pain and neck stiffness.  Skin: Positive for wound.  Negative for rash.  Neurological: Negative for weakness, numbness and headaches.  Psychiatric/Behavioral: Positive for dysphoric mood. Negative for behavioral problems.      Allergies  Review of patient's allergies indicates no known allergies.  Home Medications   Prior to Admission medications   Medication Sig Start Date End Date Taking? Authorizing Provider  fexofenadine (ALLEGRA) 60 MG tablet Take 1 tablet (60 mg total) by mouth 2 (two) times daily. 05/02/12   Rolan Bucco, MD   BP 136/89 mmHg  Pulse 70  Temp(Src) 98.2 F (36.8 C) (Oral)  Resp 18  Ht  (1.727 m)  Wt 135 lb (61.236 kg)  BMI 20.53 kg/m2  SpO2 99% Physical Exam  Constitutional: He appears well-developed.  HENT:  Head: Atraumatic.  Eyes: Pupils are equal, round, and reactive to light. No scleral icterus.  Neck: Neck supple.  Cardiovascular: Normal rate and regular rhythm.   Pulmonary/Chest: Effort normal.  Abdominal: Soft. There is no tenderness.  Musculoskeletal: Normal range of motion.  Neurological: He is alert.  Skin:  Injection sites to left antecubital area.    ED Course  Procedures (including critical care time) Labs Review Labs Reviewed  URINE RAPID DRUG SCREEN, HOSP PERFORMED - Abnormal; Notable for the following:    Cocaine POSITIVE (*)    Benzodiazepines POSITIVE (*)    Tetrahydrocannabinol POSITIVE (*)    All other components within normal limits  CBC WITH DIFFERENTIAL/PLATELET - Abnormal; Notable for the following:    Monocytes Relative 13 (*)    All other components  within normal limits  ACETAMINOPHEN LEVEL - Abnormal; Notable for the following:    Acetaminophen (Tylenol), Serum <10 (*)    All other components within normal limits  URINALYSIS, ROUTINE W REFLEX MICROSCOPIC (NOT AT [facility]) - Abnormal; Notable for the following:    Color, Urine AMBER (*)    Specific Gravity, Urine 1.035 (*)    Ketones, ur 15 (*)    All other components within normal limits  ETHANOL   COMPREHENSIVE METABOLIC PANEL    Imaging Review Dg Chest 2 View  08/18/2014   CLINICAL DATA:  Weight loss and cough ; medical clearance for drug rehabilitation program  EXAM: CHEST  2 VIEW  COMPARISON:  None.  FINDINGS: There is no edema or consolidation. The heart size and pulmonary vascularity are normal. No adenopathy. No bone lesions.  IMPRESSION: No edema or consolidation.   Electronically Signed   By: Bretta Bang III M.D.   On: 08/18/2014 16:30   I have personally reviewed and evaluated these images and lab results as part of my medical decision-making.   EKG Interpretation None      MDM   Final diagnoses:  Polysubstance abuse    Patient sent for medical clearance. Appears to medically cleared. Has had a seizure in the past but states only light use of benzodiazepine. Well-appearing now. LFTs are also reassuring. Will discharge home to follow-up tomorrow as planned.    Benjiman Core, MD 08/18/14 [phone]

## 2014-08-18 NOTE — Discharge Instructions (Signed)
You are medically cleared for further treatment.  Polysubstance Abuse When people abuse more than one drug or type of drug it is called polysubstance or polydrug abuse. For example, many smokers also drink alcohol. This is one form of polydrug abuse. Polydrug abuse also refers to the use of a drug to counteract an unpleasant effect produced by another drug. It may also be used to help with withdrawal from another drug. People who take stimulants may become agitated. Sometimes this agitation is countered with a tranquilizer. This helps protect against the unpleasant side effects. Polydrug abuse also refers to the use of different drugs at the same time.  Anytime drug use is interfering with normal living activities, it has become abuse. This includes problems with family and friends. Psychological dependence has developed when your mind tells you that the drug is needed. This is usually followed by physical dependence which has developed when continuing increases of drug are required to get the same feeling or "high". This is known as addiction or chemical dependency. A person's risk is much higher if there is a history of chemical dependency in the family. SIGNS OF CHEMICAL DEPENDENCY  You have been told by friends or family that drugs have become a problem.  You fight when using drugs.  You are having blackouts (not remembering what you do while using).  You feel sick from using drugs but continue using.  You lie about use or amounts of drugs (chemicals) used.  You need chemicals to get you going.  You are suffering in work performance or in school because of drug use.  You get sick from use of drugs but continue to use anyway.  You need drugs to relate to people or feel comfortable in social situations.  You use drugs to forget problems. "Yes" answered to any of the above signs of chemical dependency indicates there are problems. The longer the use of drugs continues, the greater the  problems will become. If there is a family history of drug or alcohol use, it is best not to experiment with these drugs. Continual use leads to tolerance. After tolerance develops more of the drug is needed to get the same feeling. This is followed by addiction. With addiction, drugs become the most important part of life. It becomes more important to take drugs than participate in the other usual activities of life. This includes relating to friends and family. Addiction is followed by dependency. Dependency is a condition where drugs are now needed not just to get high, but to feel normal. Addiction cannot be cured but it can be stopped. This often requires outside help and the care of professionals. Treatment centers are listed in the yellow pages under: Cocaine, Narcotics, and Alcoholics Anonymous. Most hospitals and clinics can refer you to a specialized care center. Talk to your caregiver if you need help. Document Released: 08/10/2004 Document Revised: 03/13/2011 Document Reviewed: 12/19/2004 [facility] Patient Information 2015 [place], [place]. This information is not intended to replace advice given to you by your health care provider. Make sure you discuss any questions you have with your health care provider.

## 2014-08-18 NOTE — ED Notes (Signed)
Patient transported to X-ray 

## 2015-04-19 DIAGNOSIS — F329 Major depressive disorder, single episode, unspecified: Secondary | ICD-10-CM

## 2015-04-19 DIAGNOSIS — B182 Chronic viral hepatitis C: Secondary | ICD-10-CM

## 2015-04-19 DIAGNOSIS — F32A Depression, unspecified: Secondary | ICD-10-CM

## 2015-10-13 ENCOUNTER — Encounter: Payer: Self-pay | Admitting: *Deleted

## 2015-10-13 ENCOUNTER — Emergency Department
Admission: EM | Admit: 2015-10-13 | Discharge: 2015-10-13 | Disposition: A | Discharge status: Home / Self Care | Payer: Self-pay | Attending: Student | Admitting: Student

## 2015-10-13 ENCOUNTER — Emergency Department: Payer: Self-pay

## 2015-10-13 DIAGNOSIS — Y9389 Activity, other specified: Secondary | ICD-10-CM | POA: Insufficient documentation

## 2015-10-13 DIAGNOSIS — W294XXA Contact with nail gun, initial encounter: Secondary | ICD-10-CM | POA: Insufficient documentation

## 2015-10-13 DIAGNOSIS — S91331A Puncture wound without foreign body, right foot, initial encounter: Secondary | ICD-10-CM | POA: Insufficient documentation

## 2015-10-13 DIAGNOSIS — Z23 Encounter for immunization: Secondary | ICD-10-CM | POA: Insufficient documentation

## 2015-10-13 DIAGNOSIS — Z79899 Other long term (current) drug therapy: Secondary | ICD-10-CM | POA: Insufficient documentation

## 2015-10-13 DIAGNOSIS — I10 Essential (primary) hypertension: Secondary | ICD-10-CM | POA: Insufficient documentation

## 2015-10-13 DIAGNOSIS — Y929 Unspecified place or not applicable: Secondary | ICD-10-CM | POA: Insufficient documentation

## 2015-10-13 DIAGNOSIS — F17210 Nicotine dependence, cigarettes, uncomplicated: Secondary | ICD-10-CM | POA: Insufficient documentation

## 2015-10-13 DIAGNOSIS — Y990 Civilian activity done for income or pay: Secondary | ICD-10-CM | POA: Insufficient documentation

## 2015-10-13 MED ORDER — TETANUS-DIPHTH-ACELL PERTUSSIS 5-2.5-18.5 LF-MCG/0.5 IM SUSP
0.5000 mL | Freq: Once | INTRAMUSCULAR | Status: AC
  Administered 2015-10-13: 0.5 mL via INTRAMUSCULAR
  Filled 2015-10-13: qty 0.5

## 2015-10-13 MED ORDER — CIPROFLOXACIN HCL 500 MG PO TABS: 500.0000 mg | ORAL_TABLET | Freq: Two times a day (BID) | ORAL | 0 refills | Status: AC

## 2015-10-13 MED ORDER — NAPROXEN 500 MG PO TABS
500.0000 mg | ORAL_TABLET | Freq: Two times a day (BID) | ORAL | 0 refills | Status: DC
Start: 2015-10-13 — End: 2016-04-15

## 2015-10-13 NOTE — ED Provider Notes (Signed)
[facility], [place] Emergency Department Provider Note   ____________________________________________   First MD Initiated Contact with Patient 10/13/15 1818     (approximate)  I have reviewed the triage vital signs and the nursing notes.   HISTORY  Chief Complaint Foot Pain    HPI Thomas Benitez is a 30 y.o. male who presents with pain in his right foot after stepping on a rusty nail earlier today. Patient was wearing a boot, and nail went through. Small puncture wound present to right heel, patient cleaned wound with hydrogen peroxide after it happened. Patient does not have any pain while sitting on the stretcher. Reports when he is limping his pain is about a 6/10 and if he puts pressure on his right foot the pain is 8/10. Date of last tetanus vaccination estimated to be about 14 years ago. Denies swelling or bruising to bottom of foot, restriction in ROM, numbness or tingling.    Past Medical History:  Diagnosis Date  . Cocaine addiction (HCC)   . Depression   . Hepatitis C   . Hypertension   . Seizures (HCC)    pt states "i only had one before"   . Substance abuse     Patient Active Problem List   Diagnosis Date Noted  . Depression 04/07/2014  . Hepatitis C 03/24/2014  . Opiate dependence, continuous (HCC) 03/04/2012  . Alcohol dependence (HCC) 03/04/2012  . Cannabis abuse, daily use 03/04/2012    History reviewed. No pertinent surgical history.  Prior to Admission medications   Medication Sig Start Date End Date Taking? Authorizing Provider  ciprofloxacin (CIPRO) 500 MG tablet Take 1 tablet (500 mg total) by mouth 2 (two) times daily. 10/13/15 10/18/15  Chinita Pester, FNP  fexofenadine (ALLEGRA) 60 MG tablet Take 1 tablet (60 mg total) by mouth 2 (two) times daily. 05/02/12   Rolan Bucco, MD  naproxen (NAPROSYN) 500 MG tablet Take 1 tablet (500 mg total) by mouth 2 (two) times daily with a meal. 10/13/15   Chinita Pester, FNP     Allergies Review of patient's allergies indicates no known allergies.  Family History  Problem Relation Age of Onset  . Heart attack Father 29  . Ulcers Father     Bleeding    Social History Social History  Substance Use Topics  . Smoking status: Current Every Day Smoker    Packs/day: 1.00    Types: Cigarettes  . Smokeless tobacco: Never Used  . Alcohol use No     Comment: 4 40's a day last drink 7 weeks ago    Review of Systems Constitutional: No fever/chills Musculoskeletal: Positive for pain in right heel. Skin: Positive for puncture wound to right foot. Neurological: Negative for headaches, focal weakness or numbness.  ____________________________________________   PHYSICAL EXAM:  VITAL SIGNS: ED Triage Vitals  Enc Vitals Group     BP 10/13/15 1806 133/90     Pulse Rate 10/13/15 1806 (!) 105     Resp 10/13/15 1806 16     Temp 10/13/15 1806 98 F (36.7 C)     Temp Source 10/13/15 1806 Oral     SpO2 10/13/15 1806 98 %     Weight 10/13/15 1808 149 lb (67.6 kg)     Height 10/13/15 1808 5\' 8"  (1.727 m)     Head Circumference --      Peak Flow --      Pain Score 10/13/15 1809 7     Pain Loc --  Pain Edu? --      Excl. in GC? --     Constitutional: Alert and oriented. Well appearing and in no acute distress. Eyes: Conjunctivae are normal.  Head: Atraumatic. Neck: No stridor. Supple, full ROM without pain or difficulty.  Cardiovascular: Right DP and PT pulses 2+. Right toes warm with brisk capillary refill.  Respiratory: Normal respiratory effort.  No retractions.  Musculoskeletal: ROM to right foot/ankle completed without difficulty, strength 5/5.  Neurologic:  Normal speech and language. No gross focal neurologic deficits are appreciated.  Skin:  Small puncture wound to right heel, without active bleeding, no bruising or purulence noted.  Psychiatric: Mood and affect are normal. Speech and behavior are  normal.  ____________________________________________   LABS (all labs ordered are listed, but only abnormal results are displayed)  Labs Reviewed - No data to display ____________________________________________  EKG  None. ____________________________________________  RADIOLOGY  XR of right foot shows no retained foreign body or acute bony injury of the foot.  ____________________________________________   PROCEDURES  Procedure(s) performed: None  Procedures  Critical Care performed: No  ____________________________________________   INITIAL IMPRESSION / ASSESSMENT AND PLAN / ED COURSE  Pertinent labs & imaging results that were available during my care of the patient were reviewed by me and considered in my medical decision making (see chart for details).  Patient presents with puncture wound to the right heel. Patient given Tdap vaccination in ED. Wound cleansed and bandaged in ED. Patient given prescriptions for naprosyn and ciprofloxacin. Patient to follow up with podiatry as needed. No other emergency medicine complaints at this time.   Clinical Course     ____________________________________________   FINAL CLINICAL IMPRESSION(S) / ED DIAGNOSES  Final diagnoses:  Puncture wound of right foot, initial encounter      NEW MEDICATIONS STARTED DURING THIS VISIT:  New Prescriptions   CIPROFLOXACIN (CIPRO) 500 MG TABLET    Take 1 tablet (500 mg total) by mouth 2 (two) times daily.   NAPROXEN (NAPROSYN) 500 MG TABLET    Take 1 tablet (500 mg total) by mouth 2 (two) times daily with a meal.     Note:  This document was prepared using Dragon voice recognition software and may include unintentional dictation errors.   Chinita PesterCari B Xana Bradt, FNP 10/13/15 1948    Gayla DossEryka A Gayle, MD 10/13/15 [phone]

## 2015-10-13 NOTE — ED Triage Notes (Signed)
Pt reports while at work today he stepped on a rusty nail that went into right heel. No bleeding noted at this time. Pt has small amount of blood on soak and reports having pain at this time. Pt able to walk but reports increased pain with weight. Pt unsure of last tetanus.

## 2015-10-25 ENCOUNTER — Emergency Department: Payer: Self-pay

## 2015-10-25 ENCOUNTER — Emergency Department
Admission: EM | Admit: 2015-10-25 | Discharge: 2015-10-25 | Discharge status: Against Medical Advice | Payer: Self-pay | Attending: Emergency Medicine | Admitting: Emergency Medicine

## 2015-10-25 DIAGNOSIS — I469 Cardiac arrest, cause unspecified: Secondary | ICD-10-CM | POA: Insufficient documentation

## 2015-10-25 DIAGNOSIS — F17210 Nicotine dependence, cigarettes, uncomplicated: Secondary | ICD-10-CM | POA: Insufficient documentation

## 2015-10-25 DIAGNOSIS — T401X1A Poisoning by heroin, accidental (unintentional), initial encounter: Secondary | ICD-10-CM | POA: Insufficient documentation

## 2015-10-25 DIAGNOSIS — I10 Essential (primary) hypertension: Secondary | ICD-10-CM | POA: Insufficient documentation

## 2015-10-25 DIAGNOSIS — T50901A Poisoning by unspecified drugs, medicaments and biological substances, accidental (unintentional), initial encounter: Secondary | ICD-10-CM

## 2015-10-25 DIAGNOSIS — F1910 Other psychoactive substance abuse, uncomplicated: Secondary | ICD-10-CM | POA: Insufficient documentation

## 2015-10-25 DIAGNOSIS — Z791 Long term (current) use of non-steroidal anti-inflammatories (NSAID): Secondary | ICD-10-CM | POA: Insufficient documentation

## 2015-10-25 LAB — COMPREHENSIVE METABOLIC PANEL
ALBUMIN: 4.1 g/dL (ref 3.5–5.0)
ALK PHOS: 59 U/L (ref 38–126)
ALT: 93 U/L — AB (ref 17–63)
AST: 63 U/L — AB (ref 15–41)
Anion gap: 10 (ref 5–15)
BILIRUBIN TOTAL: 0.6 mg/dL (ref 0.3–1.2)
BUN: 13 mg/dL (ref 6–20)
CALCIUM: 8.8 mg/dL — AB (ref 8.9–10.3)
CO2: 26 mmol/L (ref 22–32)
CREATININE: 0.99 mg/dL (ref 0.61–1.24)
Chloride: 104 mmol/L (ref 101–111)
GFR calc Af Amer: >60 mL/min (ref >60)
GLUCOSE: 117 mg/dL — AB (ref 65–99)
Potassium: 4.8 mmol/L (ref 3.5–5.1)
Sodium: 140 mmol/L (ref 135–145)
TOTAL PROTEIN: 7.9 g/dL (ref 6.5–8.1)

## 2015-10-25 LAB — CBC
HEMATOCRIT: 40.5 % (ref 40.0–52.0)
HEMOGLOBIN: 14.4 g/dL (ref 13.0–18.0)
MCH: 31.1 pg (ref 26.0–34.0)
MCHC: 35.5 g/dL (ref 32.0–36.0)
MCV: 87.8 fL (ref 80.0–100.0)
Platelets: 227 10*3/uL (ref 150–440)
RBC: 4.62 MIL/uL (ref 4.40–5.90)
RDW: 14 % (ref 11.5–14.5)
WBC: 22.8 10*3/uL — AB (ref 3.8–10.6)

## 2015-10-25 LAB — URINE DRUG SCREEN, QUALITATIVE (ARMC ONLY)
AMPHETAMINES, UR SCREEN: NOT DETECTED
BENZODIAZEPINE, UR SCRN: POSITIVE — AB
Barbiturates, Ur Screen: NOT DETECTED
CANNABINOID 50 NG, UR ~~LOC~~: POSITIVE — AB
Cocaine Metabolite,Ur ~~LOC~~: POSITIVE — AB
MDMA (Ecstasy)Ur Screen: NOT DETECTED
Methadone Scn, Ur: NOT DETECTED
OPIATE, UR SCREEN: POSITIVE — AB
PHENCYCLIDINE (PCP) UR S: NOT DETECTED
Tricyclic, Ur Screen: NOT DETECTED

## 2015-10-25 LAB — TROPONIN I: TROPONIN I: 0.04 ng/mL — AB (ref <0.03)

## 2015-10-25 NOTE — ED Notes (Signed)
Pt also refusing to wear heart monitor or blood pressure cuff at this time.

## 2015-10-25 NOTE — ED Notes (Signed)
EDP acknowledged and notified prime doc to cancel admission.

## 2015-10-25 NOTE — ED Notes (Signed)
Pt refused to stay.  EDP notified of patient leaving AMA.  No IV in place.  Pt's fiancee at bedside.

## 2015-10-25 NOTE — ED Notes (Signed)
Lab called with troponin of 0.04  Dr Cyril LoosenKinner aware.

## 2015-10-25 NOTE — ED Triage Notes (Addendum)
Pt brought in via ems from a friend's house with heroin overdose today.  Pt given 6mg  narcan.  cpr started at scene.  Pt alert and talking on arrival to er.  md at bedside.  Iv in place but not working and iv  d/ced on arrival to er.  2nd iv started stat.

## 2015-10-25 NOTE — ED Notes (Signed)
Pt sleeping. 

## 2015-10-25 NOTE — ED Provider Notes (Addendum)
[facility] Emergency Department Provider Note   ____________________________________________    I have reviewed the triage vital signs and the nursing notes.   HISTORY  Chief Complaint Drug Overdose     HPI Thomas Benitez is a 30 y.o. male who presents after reported cardiac arrest. Patient admits to injecting heroin, he states this is the first time he has done so in 2 years. He was reportedly found by police pulseless and they performed CPR, EMS gave intranasal and IV Narcan with good response. Patient is alert and oriented. He complains only of mild chest soreness. He denies shortness of breath.   Past Medical History:  Diagnosis Date  . Cocaine addiction (HCC)   . Depression   . Hepatitis C   . Hypertension   . Seizures (HCC)    pt states "i only had one before"   . Substance abuse     Patient Active Problem List   Diagnosis Date Noted  . Depression 04/07/2014  . Hepatitis C 03/24/2014  . Opiate dependence, continuous (HCC) 03/04/2012  . Alcohol dependence (HCC) 03/04/2012  . Cannabis abuse, daily use 03/04/2012    No past surgical history on file.  Prior to Admission medications   Medication Sig Start Date End Date Taking? Authorizing Provider  fexofenadine (ALLEGRA) 60 MG tablet Take 1 tablet (60 mg total) by mouth 2 (two) times daily. 05/02/12  Yes Rolan BuccoMelanie Belfi, MD  naproxen (NAPROSYN) 500 MG tablet Take 1 tablet (500 mg total) by mouth 2 (two) times daily with a meal. 10/13/15  Yes Cari B Triplett, FNP     Allergies Review of patient's allergies indicates no known allergies.  Family History  Problem Relation Age of Onset  . Heart attack Father 8059  . Ulcers Father     Bleeding    Social History Social History  Substance Use Topics  . Smoking status: Current Every Day Smoker    Packs/day: 1.00    Types: Cigarettes  . Smokeless tobacco: Never Used  . Alcohol use No     Comment: 4 40's a day last drink 7 weeks ago     Review of Systems  Constitutional: No fever/chills Eyes: No visual changes.  ENT: No Neck pain Cardiovascular: As above Respiratory: Denies shortness of breath. Gastrointestinal: No abdominal pain.  No nausea, no vomiting.    Musculoskeletal: Negative for back pain. Skin: Negative for rash. Neurological: Negative for headaches or weakness  10-point ROS otherwise negative.  ____________________________________________   PHYSICAL EXAM:  VITAL SIGNS: ED Triage Vitals  Enc Vitals Group     BP 10/25/15 1730 120/72     Pulse Rate 10/25/15 1730 (!) 102     Resp 10/25/15 1730 11     Temp 10/25/15 1748 98.6 F (37 C)     Temp Source 10/25/15 1748 Oral     SpO2 10/25/15 1730 99 %     Weight 10/25/15 1749 152 lb (68.9 kg)     Height 10/25/15 1749 5\' 8"  (1.727 m)     Head Circumference --      Peak Flow --      Pain Score 10/25/15 1749 4     Pain Loc --      Pain Edu? --      Excl. in GC? --     Constitutional: Alert and oriented. No acute distress. Eyes: Conjunctivae are normal.  Head: Atraumatic. Nose: No congestion/rhinnorhea. Mouth/Throat: Mucous membranes are moist.   Neck:  Painless ROM Cardiovascular:  Normal rate, regular rhythm. Grossly normal heart sounds.  Good peripheral circulation. Respiratory: Normal respiratory effort.  No retractions. Lungs CTAB. Gastrointestinal: Soft and nontender. No distention.  No CVA tenderness. Genitourinary: deferred Musculoskeletal: No lower extremity tenderness nor edema.  Warm and well perfused Neurologic:  Normal speech and language. No gross focal neurologic deficits are appreciated.  Skin:  Skin is warm, dry and intact. No rash noted. Psychiatric: Mood and affect are normal. Speech and behavior are normal.  ____________________________________________   LABS (all labs ordered are listed, but only abnormal results are displayed)  Labs Reviewed  CBC - Abnormal; Notable for the following:       Result Value   WBC  22.8 (*)    All other components within normal limits  COMPREHENSIVE METABOLIC PANEL - Abnormal; Notable for the following:    Glucose, Bld 117 (*)    Calcium 8.8 (*)    AST 63 (*)    ALT 93 (*)    All other components within normal limits  TROPONIN I - Abnormal; Notable for the following:    Troponin I 0.04 (*)    All other components within normal limits  URINE DRUG SCREEN, QUALITATIVE ([facility] ONLY) - Abnormal; Notable for the following:    Cocaine Metabolite,Ur [place] POSITIVE (*)    Opiate, Ur Screen POSITIVE (*)    Cannabinoid 50 Ng, Ur [place] POSITIVE (*)    Benzodiazepine, Ur Scrn POSITIVE (*)    All other components within normal limits   ____________________________________________  EKG  ED ECG REPORT I, Jene Every, the attending physician, personally viewed and interpreted this ECG.  Date: 10/25/2015 EKG Time: 5:33 PM Rate: 98 Rhythm: normal sinus rhythm QRS Axis: normal Intervals: normal ST/T Wave abnormalities: normal Conduction Disturbances: none Narrative Interpretation: unremarkable  ____________________________________________  RADIOLOGY  Mild vascular congestion on x-ray ____________________________________________   PROCEDURES  Procedure(s) performed: No    Critical Care performed: No ____________________________________________   INITIAL IMPRESSION / ASSESSMENT AND PLAN / ED COURSE  Pertinent labs & imaging results that were available during my care of the patient were reviewed by me and considered in my medical decision making (see chart for details).  Patient presents after heroin induced arrest, status post CPR, good response to Narcan and appears at baseline currently. EKG is unremarkable, he does have chest soreness likely from CPR. Chest x-ray shows mild pulmonary vascular congestion. Labs pending. We will monitor closely.  Clinical Course  Patient with mildly elevated troponin and elevated white blood cell count likely related to CPR  during his cardiac arrest. I will admit to the hospital for further eval ____________________________________________  ----------------------------------------- 9:00 PM on 10/25/2015 -----------------------------------------  After I admitted patient apparently left AGAINST MEDICAL ADVICE. He did have decisional capacity. Unclear reasons for AMA    FINAL CLINICAL IMPRESSION(S) / ED DIAGNOSES  Final diagnoses:  Accidental overdose of heroin, initial encounter  Polysubstance abuse  Accidental drug overdose, initial encounter  Cardiac arrest [facility])      NEW MEDICATIONS STARTED DURING THIS VISIT:  New Prescriptions   No medications on file     Note:  This document was prepared using Dragon voice recognition software and may include unintentional dictation errors.    Jene Every, MD 10/25/15 2049    Jene Every, MD 10/25/15 2101

## 2015-10-25 NOTE — ED Notes (Signed)
Pt states he found out he was being evicted and used  IV heroin today.  Pt has not used in 2 years.  Ems reports cpr started at scene.  6mg  narcan given with results.  Pt awake and alert on arrival  Tearful   md at bedside.  Pt states he also used xanax today.  Denies Si or HI.  Pt denies etoh use.  Pt cooperative.

## 2015-10-25 NOTE — ED Notes (Signed)
Pt has repeatedly threatened to leave AMA.  Pt also screaming on phone and throwing items around room.  This RN asked patient to refrain from throwing things and had Officer Vear Clock[place]ips talk with patient regarding behavior.  Pt provided with food tray per MD request.  Pt took out IV, but states "I can't walk down the highway without my clothes, my girlfriend is coming".   Pt in bed at this time.  EDP aware of situation.

## 2015-10-25 NOTE — ED Notes (Signed)
Pt now sleeping comfortably at this time.

## 2015-10-26 ENCOUNTER — Emergency Department
Admission: EM | Admit: 2015-10-26 | Discharge: 2015-10-26 | Disposition: A | Discharge status: Home / Self Care | Payer: Self-pay | Attending: Emergency Medicine | Admitting: Emergency Medicine

## 2015-10-26 ENCOUNTER — Encounter: Payer: Self-pay | Admitting: Emergency Medicine

## 2015-10-26 DIAGNOSIS — F1490 Cocaine use, unspecified, uncomplicated: Secondary | ICD-10-CM | POA: Insufficient documentation

## 2015-10-26 DIAGNOSIS — F17210 Nicotine dependence, cigarettes, uncomplicated: Secondary | ICD-10-CM | POA: Insufficient documentation

## 2015-10-26 DIAGNOSIS — Z79899 Other long term (current) drug therapy: Secondary | ICD-10-CM | POA: Insufficient documentation

## 2015-10-26 DIAGNOSIS — Z791 Long term (current) use of non-steroidal anti-inflammatories (NSAID): Secondary | ICD-10-CM | POA: Insufficient documentation

## 2015-10-26 DIAGNOSIS — F1910 Other psychoactive substance abuse, uncomplicated: Secondary | ICD-10-CM | POA: Insufficient documentation

## 2015-10-26 DIAGNOSIS — F1290 Cannabis use, unspecified, uncomplicated: Secondary | ICD-10-CM | POA: Insufficient documentation

## 2015-10-26 DIAGNOSIS — F1123 Opioid dependence with withdrawal: Secondary | ICD-10-CM | POA: Insufficient documentation

## 2015-10-26 DIAGNOSIS — I10 Essential (primary) hypertension: Secondary | ICD-10-CM | POA: Insufficient documentation

## 2015-10-26 LAB — CBC
HEMATOCRIT: 38.8 % — AB (ref 40.0–52.0)
HEMOGLOBIN: 13.9 g/dL (ref 13.0–18.0)
MCH: 31.5 pg (ref 26.0–34.0)
MCHC: 35.9 g/dL (ref 32.0–36.0)
MCV: 87.8 fL (ref 80.0–100.0)
Platelets: 216 10*3/uL (ref 150–440)
RBC: 4.42 MIL/uL (ref 4.40–5.90)
RDW: 13.8 % (ref 11.5–14.5)
WBC: 7.3 10*3/uL (ref 3.8–10.6)

## 2015-10-26 LAB — COMPREHENSIVE METABOLIC PANEL
ALK PHOS: 54 U/L (ref 38–126)
ALT: 83 U/L — AB (ref 17–63)
AST: 52 U/L — AB (ref 15–41)
Albumin: 4 g/dL (ref 3.5–5.0)
Anion gap: 7 (ref 5–15)
BUN: 12 mg/dL (ref 6–20)
CALCIUM: 9.2 mg/dL (ref 8.9–10.3)
CO2: 28 mmol/L (ref 22–32)
CREATININE: 0.77 mg/dL (ref 0.61–1.24)
Chloride: 104 mmol/L (ref 101–111)
GFR calc non Af Amer: >60 mL/min (ref >60)
GLUCOSE: 120 mg/dL — AB (ref 65–99)
Potassium: 4 mmol/L (ref 3.5–5.1)
SODIUM: 139 mmol/L (ref 135–145)
Total Bilirubin: 0.5 mg/dL (ref 0.3–1.2)
Total Protein: 7.6 g/dL (ref 6.5–8.1)

## 2015-10-26 LAB — URINE DRUG SCREEN, QUALITATIVE (ARMC ONLY)
Amphetamines, Ur Screen: NOT DETECTED
BARBITURATES, UR SCREEN: NOT DETECTED
BENZODIAZEPINE, UR SCRN: POSITIVE — AB
CANNABINOID 50 NG, UR ~~LOC~~: POSITIVE — AB
COCAINE METABOLITE, UR ~~LOC~~: POSITIVE — AB
MDMA (Ecstasy)Ur Screen: NOT DETECTED
METHADONE SCREEN, URINE: NOT DETECTED
OPIATE, UR SCREEN: POSITIVE — AB
PHENCYCLIDINE (PCP) UR S: NOT DETECTED
Tricyclic, Ur Screen: NOT DETECTED

## 2015-10-26 LAB — ETHANOL: Alcohol, Ethyl (B): <5 mg/dL (ref <5)

## 2015-10-26 NOTE — ED Triage Notes (Signed)
Pt just seen here yesterday for heroin overdose. Pt ended up leaving without getting cleared medically and [facility] will not accept pt without medical clearance.

## 2015-10-26 NOTE — Discharge Instructions (Signed)
Please proceed directly to [facility] for further treatment.

## 2015-10-26 NOTE — ED Notes (Signed)
BEHAVIORAL HEALTH ROUNDING Patient sleeping: No. Patient alert and oriented: yes Behavior appropriate: Yes.  ; If no, describe:  Nutrition and fluids offered: yes Toileting and hygiene offered: Yes  Sitter present: q15 minute observations and security  monitoring Law enforcement present: Yes  ODS  Pt observed lying on the bed Pt visualized with NAD  No verbalized needs or concerns at this time  Girlfriend is sitting on the bed with him and she seems to be encouraging him to leave with her  - I have educated both of them about the importance of the pt traveling with [facility] staff due to the risk of more drug use prior to arriving at [facility] if he travels by personal vehicle   Continue to monitor

## 2015-10-26 NOTE — ED Notes (Signed)

## 2015-10-26 NOTE — ED Provider Notes (Signed)
[facility] Emergency Department Provider Note  Time seen: 9:21 AM  I have reviewed the triage vital signs and the nursing notes.   HISTORY  Chief Complaint Medical Clearance    HPI Thomas ShropshireRichard Benitez is a 30 y.o. male with a past medical history of hepatitis, substance abuse, who presents the emergency department for medical clearance. According to the patient and record review the patient was seen in the emergency department yesterday after heroin overdose. Patient states he been clean from heroin for 2 years until yesterday when he relapsed. Patient signed out AMA yesterday. Today he attempted to go to [facility] no referred him back to the emergency department for medical clearance. Patient denies any drug use or alcohol use since yesterday. States he also used crack cocaine yesterday. Denies any medical complaints at this time.  Past Medical History:  Diagnosis Date  . Cocaine addiction (HCC)   . Depression   . Hepatitis C   . Hypertension   . Seizures (HCC)    pt states "i only had one before"   . Substance abuse     Patient Active Problem List   Diagnosis Date Noted  . Depression 04/07/2014  . Hepatitis C 03/24/2014  . Opiate dependence, continuous (HCC) 03/04/2012  . Alcohol dependence (HCC) 03/04/2012  . Cannabis abuse, daily use 03/04/2012    History reviewed. No pertinent surgical history.  Prior to Admission medications   Medication Sig Start Date End Date Taking? Authorizing Provider  fexofenadine (ALLEGRA) 60 MG tablet Take 1 tablet (60 mg total) by mouth 2 (two) times daily. 05/02/12  Yes Rolan BuccoMelanie Belfi, MD  naproxen (NAPROSYN) 500 MG tablet Take 1 tablet (500 mg total) by mouth 2 (two) times daily with a meal. 10/13/15  Yes Cari B Triplett, FNP    No Known Allergies  Family History  Problem Relation Age of Onset  . Heart attack Father [id number]  . Ulcers Father     Bleeding    Social History Social History  Substance Use Topics  . Smoking  status: Current Every Day Smoker    Packs/day: 1.00    Types: Cigarettes  . Smokeless tobacco: Never Used  . Alcohol use No     Comment: 4 40's a day last drink 7 weeks ago    Review of Systems Constitutional: Negative for fever. Cardiovascular: Negative for chest pain. Respiratory: Negative for shortness of breath. Gastrointestinal: Negative for abdominal pain Neurological: Negative for headache 10-point ROS otherwise negative.  ____________________________________________   PHYSICAL EXAM:  VITAL SIGNS: ED Triage Vitals [10/26/15 0851]  Enc Vitals Group     BP 130/88     Pulse Rate 82     Resp 20     Temp 98.5 F (36.9 C)     Temp Source Oral     SpO2 99 %     Weight      Height      Head Circumference      Peak Flow      Pain Score      Pain Loc      Pain Edu?      Excl. in GC?     Constitutional: Alert and oriented. Well appearing and in no distress. Eyes: Normal exam ENT   Head: Normocephalic and atraumatic.   Mouth/Throat: Mucous membranes are moist. Cardiovascular: Normal rate, regular rhythm. No murmur Respiratory: Normal respiratory effort without tachypnea nor retractions. Breath sounds are clear  Gastrointestinal: Soft and nontender. No distention.  Musculoskeletal: Nontender with  normal range of motion in all extremities.  Neurologic:  Normal speech and language. No gross focal neurologic deficits  Skin:  Skin is warm, dry and intact.  Psychiatric: Mood and affect are normal.  ____________________________________________   INITIAL IMPRESSION / ASSESSMENT AND PLAN / ED COURSE  Pertinent labs & imaging results that were available during my care of the patient were reviewed by me and considered in my medical decision making (see chart for details).  The patient presents the emergency department for medical clearance to go to opiate detox at [facility]. Patient denies any medical complaints at this time. States he was clean for 2 years prior to  yesterday when he relapsed. We will work with our behavioral health nurse to attempt to get the patient back to [facility] after medical clearance. We'll redraw labs.  Patient's labs have resulted. Alcohol level is negative. Urine toxicology is positive for cocaine, opiates, cannabinoids, benzodiazepines. Patient has been accepted to [facility] for detox. ____________________________________________   FINAL CLINICAL IMPRESSION(S) / ED DIAGNOSES  Opiate withdrawal Substance abuse    Minna Antis, MD 10/26/15 1023

## 2016-04-14 DIAGNOSIS — F329 Major depressive disorder, single episode, unspecified: Secondary | ICD-10-CM | POA: Insufficient documentation

## 2016-04-14 DIAGNOSIS — F17210 Nicotine dependence, cigarettes, uncomplicated: Secondary | ICD-10-CM | POA: Insufficient documentation

## 2016-04-14 DIAGNOSIS — F1210 Cannabis abuse, uncomplicated: Secondary | ICD-10-CM | POA: Insufficient documentation

## 2016-04-14 DIAGNOSIS — I10 Essential (primary) hypertension: Secondary | ICD-10-CM | POA: Insufficient documentation

## 2016-04-14 DIAGNOSIS — F1410 Cocaine abuse, uncomplicated: Secondary | ICD-10-CM | POA: Insufficient documentation

## 2016-04-15 ENCOUNTER — Encounter: Payer: Self-pay | Admitting: Emergency Medicine

## 2016-04-15 ENCOUNTER — Emergency Department
Admission: EM | Admit: 2016-04-15 | Discharge: 2016-04-15 | Disposition: A | Discharge status: Home / Self Care | Payer: Self-pay | Attending: Emergency Medicine | Admitting: Emergency Medicine

## 2016-04-15 DIAGNOSIS — F329 Major depressive disorder, single episode, unspecified: Secondary | ICD-10-CM

## 2016-04-15 DIAGNOSIS — F1210 Cannabis abuse, uncomplicated: Secondary | ICD-10-CM

## 2016-04-15 DIAGNOSIS — F32A Depression, unspecified: Secondary | ICD-10-CM

## 2016-04-15 DIAGNOSIS — F1410 Cocaine abuse, uncomplicated: Secondary | ICD-10-CM

## 2016-04-15 LAB — COMPREHENSIVE METABOLIC PANEL
ALT: 28 U/L (ref 17–63)
AST: 30 U/L (ref 15–41)
Albumin: 4.4 g/dL (ref 3.5–5.0)
Alkaline Phosphatase: 47 U/L (ref 38–126)
Anion gap: 7 (ref 5–15)
BUN: 8 mg/dL (ref 6–20)
CHLORIDE: 106 mmol/L (ref 101–111)
CO2: 25 mmol/L (ref 22–32)
Calcium: 9.9 mg/dL (ref 8.9–10.3)
Creatinine, Ser: 0.81 mg/dL (ref 0.61–1.24)
GFR calc Af Amer: >60 mL/min (ref >60)
GFR calc non Af Amer: >60 mL/min (ref >60)
Glucose, Bld: 96 mg/dL (ref 65–99)
POTASSIUM: 3.7 mmol/L (ref 3.5–5.1)
SODIUM: 138 mmol/L (ref 135–145)
Total Bilirubin: 0.8 mg/dL (ref 0.3–1.2)
Total Protein: 8.2 g/dL — ABNORMAL HIGH (ref 6.5–8.1)

## 2016-04-15 LAB — URINE DRUG SCREEN, QUALITATIVE (ARMC ONLY)
AMPHETAMINES, UR SCREEN: NOT DETECTED
Barbiturates, Ur Screen: NOT DETECTED
Benzodiazepine, Ur Scrn: POSITIVE — AB
COCAINE METABOLITE, UR ~~LOC~~: POSITIVE — AB
Cannabinoid 50 Ng, Ur ~~LOC~~: POSITIVE — AB
MDMA (ECSTASY) UR SCREEN: NOT DETECTED
Methadone Scn, Ur: NOT DETECTED
Opiate, Ur Screen: NOT DETECTED
PHENCYCLIDINE (PCP) UR S: NOT DETECTED
TRICYCLIC, UR SCREEN: NOT DETECTED

## 2016-04-15 LAB — CBC
HCT: 42.1 % (ref 40.0–52.0)
HEMOGLOBIN: 15 g/dL (ref 13.0–18.0)
MCH: 30.7 pg (ref 26.0–34.0)
MCHC: 35.5 g/dL (ref 32.0–36.0)
MCV: 86.3 fL (ref 80.0–100.0)
PLATELETS: 257 10*3/uL (ref 150–440)
RBC: 4.88 MIL/uL (ref 4.40–5.90)
RDW: 12.9 % (ref 11.5–14.5)
WBC: 9.4 10*3/uL (ref 3.8–10.6)

## 2016-04-15 LAB — SALICYLATE LEVEL

## 2016-04-15 LAB — ACETAMINOPHEN LEVEL: Acetaminophen (Tylenol), Serum: <10 ug/mL — ABNORMAL LOW (ref 10–30)

## 2016-04-15 LAB — ETHANOL

## 2016-04-15 NOTE — Progress Notes (Signed)
Patient requested to leave this SW explained he will need to wait as his nurse busy with other patient. LCSW left a note on ED RN's nurse and told security guard patient wants to use the phone.  [employer] LCSW [phone]

## 2016-04-15 NOTE — Discharge Instructions (Signed)
You have been seen in the Emergency Department (ED)  today for a psychiatric complaint.  You have been evaluated by psychiatry and we believe you are safe to be discharged from the hospital.   ° °Please return to the Emergency Department (ED)  immediately if you have ANY thoughts of hurting yourself or anyone else, so that we may help you. ° °Please avoid alcohol and drug use. ° °Follow up with your doctor and/or therapist as soon as possible regarding today's ED  visit.  ° °You may call crisis hotline for [place] at [phone]. ° °

## 2016-04-15 NOTE — ED Notes (Signed)
Report was received from Noel W., RN; Pt. Verbalizes no complaints or distress; verbalizes having S.I.; denies having Hi. Continue to monitor with 15 min. Monitoring. 
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
## 2016-04-15 NOTE — ED Provider Notes (Signed)
[facility] Emergency Department Provider Note   ____________________________________________   First MD Initiated Contact with Patient 04/15/16 [phone]     (approximate)  I have reviewed the triage vital signs and the nursing notes.   HISTORY  Chief Complaint Withdrawal and Hallucinations    HPI Thomas Benitez is a 31 y.o. male who presents to the ED from home with a chief complaint of depression, suicidal ideation, withdrawal symptoms from Suboxone and alcohol. Patient reports he last used Suboxone and alcohol 2 days ago and feels shaky. Once help with detox and also requesting psychiatric evaluation for hallucinations and suicidal ideation without plan. Denies HI. States "a lot is going on and can't handle it". Voices no medical complaints.   Past Medical History:  Diagnosis Date  . Cocaine addiction (HCC)   . Depression   . Hepatitis C   . Hypertension   . Seizures (HCC)    pt states "i only had one before"   . Substance abuse     Patient Active Problem List   Diagnosis Date Noted  . Depression 04/07/2014  . Hepatitis C 03/24/2014  . Opiate dependence, continuous (HCC) 03/04/2012  . Alcohol dependence (HCC) 03/04/2012  . Cannabis abuse, daily use 03/04/2012    History reviewed. No pertinent surgical history.  Prior to Admission medications   Medication Sig Start Date End Date Taking? Authorizing Provider  buprenorphine-naloxone (SUBOXONE) 8-2 mg SUBL SL tablet Place 1 tablet under the tongue daily.   Yes Historical Provider, MD    Allergies Patient has no known allergies.  Family History  Problem Relation Age of Onset  . Heart attack Father 3  . Ulcers Father     Bleeding    Social History Social History  Substance Use Topics  . Smoking status: Current Every Day Smoker    Packs/day: 1.00    Types: Cigarettes  . Smokeless tobacco: Never Used  . Alcohol use No     Comment: 4 40's a day last drink 7 weeks ago    Review  of Systems  Constitutional: No fever/chills. Eyes: No visual changes. ENT: No sore throat. Cardiovascular: Denies chest pain. Respiratory: Denies shortness of breath. Gastrointestinal: No abdominal pain.  No nausea, no vomiting.  No diarrhea.  No constipation. Genitourinary: Negative for dysuria. Musculoskeletal: Negative for back pain. Skin: Negative for rash. Neurological: Negative for headaches, focal weakness or numbness. Psychiatric:Positive for depression with SI.  10-point ROS otherwise negative.  ____________________________________________   PHYSICAL EXAM:  VITAL SIGNS: ED Triage Vitals  Enc Vitals Group     BP 04/15/16 0002 117/70     Pulse Rate 04/15/16 0002 61     Resp 04/15/16 0002 18     Temp 04/15/16 0002 98 F (36.7 C)     Temp Source 04/15/16 0002 Oral     SpO2 04/15/16 0002 97 %     Weight 04/15/16 0002 140 lb (63.5 kg)     Height 04/15/16 0002  (1.727 m)     Head Circumference --      Peak Flow --      Pain Score 04/15/16 0010 7     Pain Loc --      Pain Edu? --      Excl. in GC? --     Constitutional: Alert and oriented. Disheveled appearing and in no acute distress. Eyes: Conjunctivae are normal. PERRL. EOMI. Head: Atraumatic. Nose: No congestion/rhinnorhea. Mouth/Throat: Mucous membranes are moist.  Oropharynx non-erythematous. Neck: No stridor.  Cardiovascular: Normal rate, regular rhythm. Grossly normal heart sounds.  Good peripheral circulation. Respiratory: Normal respiratory effort.  No retractions. Lungs CTAB. Gastrointestinal: Soft and nontender. No distention. No abdominal bruits. No CVA tenderness. Musculoskeletal: No lower extremity tenderness nor edema.  No joint effusions. Neurologic:  Normal speech and language. No gross focal neurologic deficits are appreciated. No gait instability. Skin:  Skin is warm, dry and intact. No rash noted. Psychiatric: Mood and affect are flat. Speech and behavior are  normal.  ____________________________________________   LABS (all labs ordered are listed, but only abnormal results are displayed)  Labs Reviewed  COMPREHENSIVE METABOLIC PANEL - Abnormal; Notable for the following:       Result Value   Total Protein 8.2 (*)    All other components within normal limits  URINE DRUG SCREEN, QUALITATIVE ([facility] ONLY) - Abnormal; Notable for the following:    Cocaine Metabolite,Ur [place] POSITIVE (*)    Cannabinoid 50 Ng, Ur [place] POSITIVE (*)    Benzodiazepine, Ur Scrn POSITIVE (*)    All other components within normal limits  ACETAMINOPHEN LEVEL - Abnormal; Notable for the following:    Acetaminophen (Tylenol), Serum <10 (*)    All other components within normal limits  ETHANOL  CBC  SALICYLATE LEVEL   ____________________________________________  EKG  None ____________________________________________  RADIOLOGY  None ____________________________________________   PROCEDURES  Procedure(s) performed: None  Procedures  Critical Care performed: No  ____________________________________________   INITIAL IMPRESSION / ASSESSMENT AND PLAN / ED COURSE  Pertinent labs & imaging results that were available during my care of the patient were reviewed by me and considered in my medical decision making (see chart for details).  31 year old male feeling withdrawal symptoms from Suboxone and alcohol, as well as depressed with suicidal ideation. Contracts for safety in the emergency department. Will obtain screening lab work including tox screen and consult TTS and psychiatry for evaluation. Patient contracts for safety while in the emergency department.  Clinical Course as of Apr 15 637  Sat Apr 15, 2016  0223 Laboratory and urinalysis results remarkable for cocaine metabolites, cannabinoids and benzodiazepines. Patient is currently medically cleared for psychiatric disposition. He will remain in the ED voluntarily pending TTS and [facility] psychiatry  evaluations.  [JS]  [id number] Patient sleeping in no acute distress. Awaiting [facility] psychiatry evaluation.  [JS]    Clinical Course User Index [JS] Irean Hong, MD     ____________________________________________   FINAL CLINICAL IMPRESSION(S) / ED DIAGNOSES  Final diagnoses:  Depression, unspecified depression type  Cocaine abuse  Cannabis abuse      NEW MEDICATIONS STARTED DURING THIS VISIT:  New Prescriptions   No medications on file     Note:  This document was prepared using Dragon voice recognition software and may include unintentional dictation errors.    Irean Hong, MD 04/15/16 [phone]

## 2016-04-15 NOTE — ED Triage Notes (Signed)
Pt is ambulatory to triage with c/o withdrawal symptoms from ceboxen and alcohol which were both used x2 days ago. Pt states that he does have SI ideation but without plan or intent. Pt also reports hallucinations. Pt states that he want helps with detox and needs phychiatric evaluation.

## 2016-04-15 NOTE — ED Provider Notes (Signed)
-----------------------------------------   9:43 AM on 04/15/2016 -----------------------------------------   Blood pressure (!) 105/57, pulse 94, temperature 98.6 F (37 C), temperature source Oral, resp. rate 16, height  (1.727 m), weight 140 lb (63.5 kg), SpO2 97 %.  Patient is here voluntary for polysubstance abuse and requesting detox. He is now requesting to go home. He hasn't been seen by psychiatry or TTS yet. I explained to him that the psychiatrist is actually doing his rounds at this time and should be shortly however patient wishes to go home and says he will go to [facility] for detox. He denies any suicidal or homicidal ideation. CIWA 0 with no evidence of withdrawals. Labs were reviewed with no acute findigns other than tox screen positive for cocaine and marijuana and benzos.   Nita Sickle, MD 04/15/16 [phone]

## 2016-04-15 NOTE — ED Notes (Signed)
AAOx3.  Skin warm and dry.  NAD.  Denies SI/HI.  Ambulates with easy and steady gait. 

## 2016-04-15 NOTE — ED Notes (Signed)
Patient states he wishes to leave hospital.  Dr. Don Perking alerted and will come to assess patient. Patient informed of plan and is agreeable to wait until seen by DR. Don Perking.

## 2016-04-15 NOTE — ED Notes (Signed)
Thomas Benitez, [phone], introduced self as pt's wife, clarified that pt referred to Marchelle Folks as girlfriend, Marchelle Folks retracted to "wife, fiancee, girlfriend... I'm pregnant again", given HIPAA compliant pt status, reports the pt is "stressed out and off, agitated" - nothing further, denies knowing of any diagnoses or meds for pt

## 2016-09-17 ENCOUNTER — Encounter: Payer: Self-pay | Admitting: Emergency Medicine

## 2016-09-17 ENCOUNTER — Emergency Department
Admission: EM | Admit: 2016-09-17 | Discharge: 2016-09-17 | Disposition: A | Discharge status: Home / Self Care | Payer: Self-pay | Attending: Emergency Medicine | Admitting: Emergency Medicine

## 2016-09-17 ENCOUNTER — Emergency Department: Payer: Self-pay

## 2016-09-17 DIAGNOSIS — S0990XA Unspecified injury of head, initial encounter: Secondary | ICD-10-CM

## 2016-09-17 DIAGNOSIS — Y929 Unspecified place or not applicable: Secondary | ICD-10-CM | POA: Insufficient documentation

## 2016-09-17 DIAGNOSIS — Y999 Unspecified external cause status: Secondary | ICD-10-CM | POA: Insufficient documentation

## 2016-09-17 DIAGNOSIS — Y939 Activity, unspecified: Secondary | ICD-10-CM | POA: Insufficient documentation

## 2016-09-17 DIAGNOSIS — S0101XA Laceration without foreign body of scalp, initial encounter: Secondary | ICD-10-CM | POA: Insufficient documentation

## 2016-09-17 DIAGNOSIS — F17210 Nicotine dependence, cigarettes, uncomplicated: Secondary | ICD-10-CM | POA: Insufficient documentation

## 2016-09-17 DIAGNOSIS — I10 Essential (primary) hypertension: Secondary | ICD-10-CM | POA: Insufficient documentation

## 2016-09-17 MED ORDER — IBUPROFEN 600 MG PO TABS: 600.0000 mg | ORAL_TABLET | Freq: Once | ORAL | Status: DC

## 2016-09-17 MED ORDER — BACITRACIN ZINC 500 UNIT/GM EX OINT
TOPICAL_OINTMENT | CUTANEOUS | Status: AC
  Administered 2016-09-17: 1 via TOPICAL
  Filled 2016-09-17: qty 0.9

## 2016-09-17 MED ORDER — LIDOCAINE HCL (PF) 1 % IJ SOLN
5.0000 mL | Freq: Once | INTRAMUSCULAR | Status: AC
  Administered 2016-09-17: 5 mL via INTRADERMAL

## 2016-09-17 MED ORDER — ETODOLAC 200 MG PO CAPS: 200.0000 mg | ORAL_CAPSULE | Freq: Three times a day (TID) | ORAL | 0 refills | Status: DC

## 2016-09-17 MED ORDER — BACITRACIN ZINC 500 UNIT/GM EX OINT
TOPICAL_OINTMENT | Freq: Two times a day (BID) | CUTANEOUS | Status: DC
  Administered 2016-09-17: 1 via TOPICAL

## 2016-09-17 MED ORDER — BACITRACIN ZINC 500 UNIT/GM EX OINT: TOPICAL_OINTMENT | CUTANEOUS | 0 refills | Status: AC

## 2016-09-17 MED ORDER — LIDOCAINE HCL (PF) 1 % IJ SOLN
INTRAMUSCULAR | Status: AC
  Administered 2016-09-17: 5 mL via INTRADERMAL
  Filled 2016-09-17: qty 5

## 2016-09-17 NOTE — Discharge Instructions (Signed)
Please have your staples removed in 7 days

## 2016-09-17 NOTE — ED Notes (Signed)
Call from person identifying self as wife of pt demanding information, HIPAA compliant info given, person called this RN a "goddamn motherfucking dumbass" and requested more information, phone call directed to pt

## 2016-09-17 NOTE — ED Notes (Signed)
Patient transported to CT 
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
## 2016-09-25 ENCOUNTER — Emergency Department
Admission: EM | Admit: 2016-09-25 | Discharge: 2016-09-25 | Disposition: A | Discharge status: Home / Self Care | Payer: Self-pay | Attending: Emergency Medicine | Admitting: Emergency Medicine

## 2016-09-25 ENCOUNTER — Encounter: Payer: Self-pay | Admitting: Emergency Medicine

## 2016-09-25 DIAGNOSIS — Z4802 Encounter for removal of sutures: Secondary | ICD-10-CM

## 2016-09-25 DIAGNOSIS — S0191XD Laceration without foreign body of unspecified part of head, subsequent encounter: Secondary | ICD-10-CM | POA: Insufficient documentation

## 2016-09-25 DIAGNOSIS — I10 Essential (primary) hypertension: Secondary | ICD-10-CM | POA: Insufficient documentation

## 2016-09-25 DIAGNOSIS — F17210 Nicotine dependence, cigarettes, uncomplicated: Secondary | ICD-10-CM | POA: Insufficient documentation

## 2016-09-25 NOTE — ED Triage Notes (Signed)
Here for staple removal scalp, put in 9 days ago.
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
## 2016-09-25 NOTE — ED Notes (Signed)
Here for staple removal  Healing well

## 2018-03-23 ENCOUNTER — Other Ambulatory Visit: Payer: Self-pay

## 2018-03-23 ENCOUNTER — Encounter: Payer: Self-pay | Admitting: Emergency Medicine

## 2018-03-23 ENCOUNTER — Emergency Department: Payer: Self-pay

## 2018-03-23 ENCOUNTER — Emergency Department
Admission: EM | Admit: 2018-03-23 | Discharge: 2018-03-23 | Disposition: A | Discharge status: Home / Self Care | Payer: Self-pay | Attending: Emergency Medicine | Admitting: Emergency Medicine

## 2018-03-23 DIAGNOSIS — J209 Acute bronchitis, unspecified: Secondary | ICD-10-CM | POA: Insufficient documentation

## 2018-03-23 DIAGNOSIS — A691 Other Vincent's infections: Secondary | ICD-10-CM

## 2018-03-23 DIAGNOSIS — F17210 Nicotine dependence, cigarettes, uncomplicated: Secondary | ICD-10-CM | POA: Insufficient documentation

## 2018-03-23 DIAGNOSIS — I77819 Aortic ectasia, unspecified site: Secondary | ICD-10-CM

## 2018-03-23 DIAGNOSIS — I719 Aortic aneurysm of unspecified site, without rupture: Secondary | ICD-10-CM | POA: Insufficient documentation

## 2018-03-23 DIAGNOSIS — R69 Illness, unspecified: Secondary | ICD-10-CM

## 2018-03-23 DIAGNOSIS — J111 Influenza due to unidentified influenza virus with other respiratory manifestations: Secondary | ICD-10-CM | POA: Insufficient documentation

## 2018-03-23 DIAGNOSIS — I10 Essential (primary) hypertension: Secondary | ICD-10-CM | POA: Insufficient documentation

## 2018-03-23 DIAGNOSIS — K056 Periodontal disease, unspecified: Secondary | ICD-10-CM | POA: Insufficient documentation

## 2018-03-23 LAB — COMPREHENSIVE METABOLIC PANEL
ALT: 52 U/L — ABNORMAL HIGH (ref 0–44)
ANION GAP: 9 (ref 5–15)
AST: 42 U/L — ABNORMAL HIGH (ref 15–41)
Albumin: 3.9 g/dL (ref 3.5–5.0)
Alkaline Phosphatase: 48 U/L (ref 38–126)
BILIRUBIN TOTAL: 0.7 mg/dL (ref 0.3–1.2)
BUN: 10 mg/dL (ref 6–20)
CHLORIDE: 98 mmol/L (ref 98–111)
CO2: 26 mmol/L (ref 22–32)
Calcium: 8.9 mg/dL (ref 8.9–10.3)
Creatinine, Ser: 0.77 mg/dL (ref 0.61–1.24)
GFR calc Af Amer: >60 mL/min (ref >60)
GFR calc non Af Amer: >60 mL/min (ref >60)
GLUCOSE: 111 mg/dL — AB (ref 70–99)
POTASSIUM: 3.7 mmol/L (ref 3.5–5.1)
SODIUM: 133 mmol/L — AB (ref 135–145)
TOTAL PROTEIN: 8.5 g/dL — AB (ref 6.5–8.1)

## 2018-03-23 LAB — CBC WITH DIFFERENTIAL/PLATELET
Abs Immature Granulocytes: 0.03 10*3/uL (ref 0.00–0.07)
Basophils Absolute: 0 10*3/uL (ref 0.0–0.1)
Basophils Relative: 0 %
EOS PCT: 0 %
Eosinophils Absolute: 0 10*3/uL (ref 0.0–0.5)
HEMATOCRIT: 44.2 % (ref 39.0–52.0)
Hemoglobin: 15.1 g/dL (ref 13.0–17.0)
Immature Granulocytes: 0 %
LYMPHS ABS: 0.9 10*3/uL (ref 0.7–4.0)
Lymphocytes Relative: 12 %
MCH: 29.1 pg (ref 26.0–34.0)
MCHC: 34.2 g/dL (ref 30.0–36.0)
MCV: 85.2 fL (ref 80.0–100.0)
MONOS PCT: 13 %
Monocytes Absolute: 1 10*3/uL (ref 0.1–1.0)
Neutro Abs: 5.8 10*3/uL (ref 1.7–7.7)
Neutrophils Relative %: 75 %
PLATELETS: 188 10*3/uL (ref 150–400)
RBC: 5.19 MIL/uL (ref 4.22–5.81)
RDW: 13.1 % (ref 11.5–15.5)
WBC: 7.7 10*3/uL (ref 4.0–10.5)
nRBC: 0 % (ref 0.0–0.2)

## 2018-03-23 LAB — INFLUENZA PANEL BY PCR (TYPE A & B)
INFLAPCR: NEGATIVE
INFLBPCR: NEGATIVE

## 2018-03-23 LAB — RAPID HIV SCREEN (HIV 1/2 AB+AG)
HIV 1/2 Antibodies: NONREACTIVE
HIV-1 P24 Antigen - HIV24: NONREACTIVE

## 2018-03-23 MED ORDER — IOHEXOL 300 MG/ML  SOLN
75.0000 mL | Freq: Once | INTRAMUSCULAR | Status: AC | PRN
  Administered 2018-03-23: 75 mL via INTRAVENOUS
  Filled 2018-03-23: qty 75

## 2018-03-23 MED ORDER — IBUPROFEN 600 MG PO TABS
600.0000 mg | ORAL_TABLET | Freq: Once | ORAL | Status: AC
Start: 2018-03-23 — End: 2018-03-23
  Administered 2018-03-23: 600 mg via ORAL
  Filled 2018-03-23: qty 1

## 2018-03-23 MED ORDER — CHLORHEXIDINE GLUCONATE 0.12 % MT SOLN: 15.0000 mL | Freq: Two times a day (BID) | OROMUCOSAL | 0 refills | Status: DC

## 2018-03-23 MED ORDER — ALBUTEROL SULFATE HFA 108 (90 BASE) MCG/ACT IN AERS
2.0000 | INHALATION_SPRAY | RESPIRATORY_TRACT | Status: DC | PRN
  Administered 2018-03-23: 2 via RESPIRATORY_TRACT
  Filled 2018-03-23: qty 6.7

## 2018-03-23 MED ORDER — METRONIDAZOLE 500 MG PO TABS: 500.0000 mg | ORAL_TABLET | Freq: Two times a day (BID) | ORAL | 0 refills | Status: DC

## 2018-03-23 NOTE — ED Notes (Signed)
Pt verbalized understanding of d/c instructions, RX, and f/u care. No further questions at this time. Pt ambulatory to the exit with steady gait  

## 2018-03-23 NOTE — ED Provider Notes (Signed)
-----------------------------------------   5:12 PM on 03/23/2018 -----------------------------------------   Blood pressure 131/88, pulse 66, temperature 98.4 F (36.9 C), temperature source Oral, resp. rate 18, height 5\' 7"  (1.702 m), weight 70.3 kg, SpO2 100 %.  Assuming care from Greig Right, PA.  In short, Thomas Benitez is a 33 y.o. male with a chief complaint of Fever and Generalized Body Aches .  Refer to the original H&P for additional details.  The current plan of care is to await CT results and decide on disposition.  ----------------------------------------- 5:26 PM on 03/23/2018 -----------------------------------------  Patient reassessed. Scattered expiratory wheezes throughout. Exam of his mouth reveals extensive gingivitis and widespread dental decay. In light of current symptoms, rapid HIV testing requested in addition to the COVID 19 workup.   ----------------------------------------- 6:10 PM on 03/23/2018 -----------------------------------------  CT of the chest is negative for suspected COVID 19 infection.  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  It does not show any active dissection and the pulmonary artery that is visualized is within normal limits.  In light of patient's symptoms and in following with the most current algorithm, patient will be discharged home and will be instructed to self quarantine for 14 days.  He will be treated for gingivitis and instructed to establish primary care.  He will be given a list of community resources.  He will be specifically instructed to schedule a follow-up appointment regarding the aortic dilatation. We had an in depth discussion regarding use of cocaine and heroin and the risk of death from those substances. All patient questions answered and he is instructed to return to the emergency department immediately for any increase in shortness of breath, difficulty breathing, or other concerns.          Chinita Pester, FNP 03/23/18 1911    Jeanmarie Plant, MD 03/24/18 [phone]

## 2018-03-23 NOTE — ED Notes (Signed)
Chest XR at bedside 

## 2018-03-23 NOTE — ED Triage Notes (Signed)
Fever and body aches x 2 days. States has not been vaccinated against flu.

## 2018-03-23 NOTE — ED Provider Notes (Signed)
[facility] Emergency Department Provider Note  ____________________________________________   First MD Initiated Contact with Patient 03/23/18 1350     (approximate)  I have reviewed the triage vital signs and the nursing notes.   HISTORY  Chief Complaint Fever and Generalized Body Aches    HPI Thomas Benitez is a 33 y.o. male presents emergency department complaining of fever, chills, body aches for 2 days.  He is also had a cough and wheezing.  He denies any recent travel or known exposure to coronavirus.  He denies any vomiting or diarrhea.  He tends to think that some of this fever may be coming from his teeth and gums as he states he has infections there.    Past Medical History:  Diagnosis Date  . Cocaine addiction (HCC)   . Depression   . Hepatitis C   . Hypertension   . Seizures (HCC)    pt states "i only had one before"   . Substance abuse [facility])     Patient Active Problem List   Diagnosis Date Noted  . Depression 04/07/2014  . Hepatitis C 03/24/2014  . Opiate dependence, continuous (HCC) 03/04/2012  . Alcohol dependence (HCC) 03/04/2012  . Cannabis abuse, daily use 03/04/2012    History reviewed. No pertinent surgical history.  Prior to Admission medications   Medication Sig Start Date End Date Taking? Authorizing Provider  buprenorphine-naloxone (SUBOXONE) 8-2 mg SUBL SL tablet Place 1 tablet under the tongue daily.    [provider]    Allergies Patient has no known allergies.  Family History  Problem Relation Age of Onset  . Heart attack Father 39  . Ulcers Father        Bleeding    Social History Social History   Tobacco Use  . Smoking status: Current Every Day Smoker    Packs/day: 1.00    Types: Cigarettes  . Smokeless tobacco: Never Used  Substance Use Topics  . Alcohol use: No    Comment: 4 40's a day last drink 7 weeks ago  . Drug use: Yes    Types: Marijuana, Cocaine    Comment: Heroin -  last used 1 month ago    Review of Systems  Constitutional: Positive fever/chills Eyes: No visual changes. ENT: No sore throat. Respiratory: Positive cough Genitourinary: Negative for dysuria. Musculoskeletal: Negative for back pain. Skin: Negative for rash.    ____________________________________________   PHYSICAL EXAM:  VITAL SIGNS: ED Triage Vitals  Enc Vitals Group     BP 03/23/18 1329 (!) 131/92     Pulse Rate 03/23/18 1329 96     Resp 03/23/18 1329 18     Temp 03/23/18 1329 (!) 101.3 F (38.5 C)     Temp Source 03/23/18 1329 Oral     SpO2 03/23/18 1329 99 %     Weight 03/23/18 1331 155 lb (70.3 kg)     Height 03/23/18 1331 5\' 7"  (1.702 m)     Head Circumference --      Peak Flow --      Pain Score 03/23/18 1331 8     Pain Loc --      Pain Edu? --      Excl. in GC? --     Constitutional: Alert and oriented. Well appearing and in no acute distress. Eyes: Conjunctivae are normal.  Head: Atraumatic. Nose: No congestion/rhinnorhea. Mouth/Throat: Mucous membranes are moist.  Poor dentition is noted, throat appears normal Neck:  supple no lymphadenopathy noted Cardiovascular:  Normal rate, regular rhythm. Heart sounds are normal Respiratory: Normal respiratory effort.  No retractions, lungs with wheezing bilaterally GU: deferred Musculoskeletal: FROM all extremities, warm and well perfused Neurologic:  Normal speech and language.  Skin:  Skin is warm, dry and intact. No rash noted. Psychiatric: Mood and affect are normal. Speech and behavior are normal.  ____________________________________________   LABS (all labs ordered are listed, but only abnormal results are displayed)  Labs Reviewed  INFLUENZA PANEL BY PCR (TYPE A & B)  CBC WITH DIFFERENTIAL/PLATELET  COMPREHENSIVE METABOLIC PANEL   ____________________________________________   ____________________________________________  RADIOLOGY  Chest x-ray is normal CT of the chest   ____________________________________________   PROCEDURES  Procedure(s) performed: No  Procedures    ____________________________________________   INITIAL IMPRESSION / ASSESSMENT AND PLAN / ED COURSE  Pertinent labs & imaging results that were available during my care of the patient were reviewed by me and considered in my medical decision making (see chart for details).   Patient is 33 year old male presents emergency department complaining of fever, cough, and poor dentition.  States he did not get a flu vaccine.  Patient is febrile on physical exam, lungs with diffuse wheezing, throat is minimally red  Influenza swab is negative  Due to the patient's presentation of fever and the decreased air movement along with wheezing a chest x-ray was ordered  Chest x-ray is negative  Discussed the case with Dr. Darnelle Catalan and Dr. Mayford Knife.  They state to go ahead and do the chest CT in case we need to do corona testing   Report given to Octaviano Glow, NP.  She is aware of the patient's condition.  She will monitor for CT results to see if coronavirus testing needs to be done.  As part of my medical decision making, I reviewed the following data within the electronic MEDICAL RECORD NUMBER Nursing notes reviewed and incorporated, Old chart reviewed, Patient signed out to Octaviano Glow, NP, Radiograph reviewed chest x-ray 1 view is negative for pneumonia, Notes from prior ED visits and [place] Controlled Substance Database  ____________________________________________   FINAL CLINICAL IMPRESSION(S) / ED DIAGNOSES  Final diagnoses:  Influenza-like illness  Acute bronchitis, unspecified organism  Dental caries      NEW MEDICATIONS STARTED DURING THIS VISIT:  New Prescriptions   No medications on file     Note:  This document was prepared using Dragon voice recognition software and may include unintentional dictation errors.    Faythe Ghee, PA-C 03/23/18 1600     Jeanmarie Plant, MD 03/24/18 1455

## 2018-03-23 NOTE — Discharge Instructions (Addendum)
See quarantine instructions.  You need to establish a primary care provider as soon as possible.  Return to the ER immediately for any symptom of concern.

## 2018-03-24 LAB — RESPIRATORY PANEL BY PCR
Adenovirus: NOT DETECTED
Bordetella pertussis: NOT DETECTED
Chlamydophila pneumoniae: NOT DETECTED
Coronavirus 229E: NOT DETECTED
Coronavirus HKU1: NOT DETECTED
Coronavirus NL63: NOT DETECTED
Coronavirus OC43: NOT DETECTED
Influenza A: NOT DETECTED
Influenza B: NOT DETECTED
Metapneumovirus: NOT DETECTED
Mycoplasma pneumoniae: NOT DETECTED
Parainfluenza Virus 1: NOT DETECTED
Parainfluenza Virus 2: NOT DETECTED
Parainfluenza Virus 3: NOT DETECTED
Parainfluenza Virus 4: NOT DETECTED
RESPIRATORY SYNCYTIAL VIRUS-RVPPCR: NOT DETECTED
RHINOVIRUS / ENTEROVIRUS - RVPPCR: NOT DETECTED

## 2021-10-27 ENCOUNTER — Emergency Department: Payer: Self-pay

## 2021-10-27 ENCOUNTER — Other Ambulatory Visit: Payer: Self-pay

## 2021-10-27 ENCOUNTER — Encounter: Payer: Self-pay | Admitting: Emergency Medicine

## 2021-10-27 ENCOUNTER — Emergency Department
Admission: EM | Admit: 2021-10-27 | Discharge: 2021-10-27 | Disposition: A | Discharge status: Home / Self Care | Payer: Self-pay | Attending: Emergency Medicine | Admitting: Emergency Medicine

## 2021-10-27 DIAGNOSIS — Z20822 Contact with and (suspected) exposure to covid-19: Secondary | ICD-10-CM | POA: Insufficient documentation

## 2021-10-27 DIAGNOSIS — J40 Bronchitis, not specified as acute or chronic: Secondary | ICD-10-CM | POA: Insufficient documentation

## 2021-10-27 DIAGNOSIS — R531 Weakness: Secondary | ICD-10-CM | POA: Insufficient documentation

## 2021-10-27 DIAGNOSIS — I10 Essential (primary) hypertension: Secondary | ICD-10-CM | POA: Insufficient documentation

## 2021-10-27 DIAGNOSIS — R051 Acute cough: Secondary | ICD-10-CM

## 2021-10-27 LAB — CBC WITH DIFFERENTIAL/PLATELET
Abs Immature Granulocytes: 0.05 10*3/uL (ref 0.00–0.07)
Basophils Absolute: 0.1 10*3/uL (ref 0.0–0.1)
Basophils Relative: 1 %
Eosinophils Absolute: 0.2 10*3/uL (ref 0.0–0.5)
Eosinophils Relative: 2 %
HCT: 41.3 % (ref 39.0–52.0)
Hemoglobin: 14.4 g/dL (ref 13.0–17.0)
Immature Granulocytes: 0 %
Lymphocytes Relative: 22 %
Lymphs Abs: 2.5 10*3/uL (ref 0.7–4.0)
MCH: 30.6 pg (ref 26.0–34.0)
MCHC: 34.9 g/dL (ref 30.0–36.0)
MCV: 87.7 fL (ref 80.0–100.0)
Monocytes Absolute: 0.9 10*3/uL (ref 0.1–1.0)
Monocytes Relative: 8 %
Neutro Abs: 7.8 10*3/uL — ABNORMAL HIGH (ref 1.7–7.7)
Neutrophils Relative %: 67 %
Platelets: 315 10*3/uL (ref 150–400)
RBC: 4.71 MIL/uL (ref 4.22–5.81)
RDW: 12.5 % (ref 11.5–15.5)
WBC: 11.6 10*3/uL — ABNORMAL HIGH (ref 4.0–10.5)
nRBC: 0 % (ref 0.0–0.2)

## 2021-10-27 LAB — RESP PANEL BY RT-PCR (FLU A&B, COVID) ARPGX2
Influenza A by PCR: NEGATIVE
Influenza B by PCR: NEGATIVE
SARS Coronavirus 2 by RT PCR: NEGATIVE

## 2021-10-27 LAB — COMPREHENSIVE METABOLIC PANEL
ALT: 28 U/L (ref 0–44)
AST: 30 U/L (ref 15–41)
Albumin: 3.9 g/dL (ref 3.5–5.0)
Alkaline Phosphatase: 55 U/L (ref 38–126)
Anion gap: 7 (ref 5–15)
BUN: 12 mg/dL (ref 6–20)
CO2: 25 mmol/L (ref 22–32)
Calcium: 9.1 mg/dL (ref 8.9–10.3)
Chloride: 110 mmol/L (ref 98–111)
Creatinine, Ser: 0.73 mg/dL (ref 0.61–1.24)
GFR, Estimated: >60 mL/min (ref >60)
Glucose, Bld: 105 mg/dL — ABNORMAL HIGH (ref 70–99)
Potassium: 3.9 mmol/L (ref 3.5–5.1)
Sodium: 142 mmol/L (ref 135–145)
Total Bilirubin: 0.6 mg/dL (ref 0.3–1.2)
Total Protein: 8 g/dL (ref 6.5–8.1)

## 2021-10-27 LAB — GROUP A STREP BY PCR: Group A Strep by PCR: NOT DETECTED

## 2021-10-27 LAB — LIPASE, BLOOD: Lipase: 45 U/L (ref 11–51)

## 2021-10-27 MED ORDER — AZITHROMYCIN 250 MG PO TABS: ORAL_TABLET | ORAL | 0 refills | Status: DC

## 2021-10-27 MED ORDER — HYDROXYZINE HCL 25 MG PO TABS
50.0000 mg | ORAL_TABLET | Freq: Once | ORAL | Status: AC
  Administered 2021-10-27: 50 mg via ORAL
  Filled 2021-10-27: qty 2

## 2021-10-27 MED ORDER — KETOROLAC TROMETHAMINE 30 MG/ML IJ SOLN
15.0000 mg | Freq: Once | INTRAMUSCULAR | Status: AC
  Administered 2021-10-27: 15 mg via INTRAVENOUS
  Filled 2021-10-27: qty 1

## 2021-10-27 MED ORDER — HYDROXYZINE HCL 25 MG PO TABS: 25.0000 mg | ORAL_TABLET | Freq: Four times a day (QID) | ORAL | 0 refills | Status: DC | PRN

## 2021-10-27 MED ORDER — ONDANSETRON HCL 4 MG/2ML IJ SOLN
4.0000 mg | Freq: Once | INTRAMUSCULAR | Status: AC
  Administered 2021-10-27: 4 mg via INTRAVENOUS
  Filled 2021-10-27: qty 2

## 2021-10-27 MED ORDER — SODIUM CHLORIDE 0.9 % IV BOLUS
1000.0000 mL | Freq: Once | INTRAVENOUS | Status: AC
  Administered 2021-10-27: 1000 mL via INTRAVENOUS

## 2021-10-27 MED ORDER — KETOROLAC TROMETHAMINE 10 MG PO TABS: 10.0000 mg | ORAL_TABLET | Freq: Four times a day (QID) | ORAL | 0 refills | Status: DC | PRN

## 2021-10-27 MED ORDER — GUAIFENESIN 100 MG/5ML PO LIQD: 5.0000 mL | ORAL | 0 refills | Status: DC | PRN

## 2021-10-27 MED ORDER — ONDANSETRON 4 MG PO TBDP: 4.0000 mg | ORAL_TABLET | Freq: Three times a day (TID) | ORAL | 0 refills | Status: DC | PRN

## 2021-10-27 NOTE — ED Notes (Signed)
Pt received discharge papers and verbalized understanding of discharge instructions. IV removed. Pt ambulates with steady gait to exit accompanied by facility staff.

## 2021-10-27 NOTE — ED Provider Notes (Signed)
[facility] Provider Note    Event Date/Time   First MD Initiated Contact with Patient 10/27/21 0135     (approximate)   History   Cough   HPI  Thomas Benitez is a 36 y.o. male who presents to the ED from [facility] with multiple medical complaints.  Patient has been residing at [facility] for the past week for Suboxone detox.  He has clinically detoxed from their acute facility and is currently living in the residential facility.  Complains of generalized body aches, sore throat, nonproductive cough, nasal congestion, nausea and generalized weakness.  Denies fever, chest pain, shortness of breath, abdominal pain, vomiting or diarrhea.     Past Medical History   Past Medical History:  Diagnosis Date  . Cocaine addiction ([place])   . Depression   . Hepatitis C   . Hypertension   . Seizures ([place])    pt states "i only had one before"   . Substance abuse [facility])      Active Problem List   Patient Active Problem List   Diagnosis Date Noted  . Depression 04/07/2014  . Hepatitis C 03/24/2014  . Opiate dependence, continuous ([place]) 03/04/2012  . Alcohol dependence ([place]) 03/04/2012  . Cannabis abuse, daily use 03/04/2012     Past Surgical History  History reviewed. No pertinent surgical history.   Home Medications   Prior to Admission medications   Medication Sig Start Date End Date Taking? Authorizing Provider  buprenorphine-naloxone (SUBOXONE) 8-2 mg SUBL SL tablet Place 1 tablet under the tongue daily.    [provider]  chlorhexidine (PERIDEX) 0.12 % solution Use as directed 15 mLs in the mouth or throat 2 (two) times daily. 03/23/18   Triplett, Johnette Abraham B, FNP  metroNIDAZOLE (FLAGYL) 500 MG tablet Take 1 tablet (500 mg total) by mouth 2 (two) times daily. 03/23/18   Victorino Dike, FNP     Allergies  Shellfish allergy, Other, and Tramadol   Family History   Family History  Problem Relation Age of Onset  . Heart attack Father 43  . Ulcers  Father        Bleeding     Physical Exam  Triage Vital Signs: ED Triage Vitals [10/27/21 0135]  Enc Vitals Group     BP (!) 129/92     Pulse Rate 85     Resp 17     Temp 98 F (36.7 C)     Temp Source Oral     SpO2 99 %     Weight 140 lb (63.5 kg)     Height 5\' 8"  (1.727 m)     Head Circumference      Peak Flow      Pain Score 7     Pain Loc      Pain Edu?      Excl. in [place]?     Updated Vital Signs: BP 118/84 (BP Location: Right Arm)   Pulse 65   Temp 98.7 F (37.1 C) (Oral)   Resp 18   Ht 5\' 8"  (1.727 m)   Wt 63.5 kg   SpO2 98%   BMI 21.29 kg/m    General: Awake, no distress.  CV:  RRR.  Good peripheral perfusion.  Resp:  Normal effort.  CTA B. Abd:  Nontender.  No distention.  Other:  Posterior oropharynx slightly reddened without tonsillar swelling, exudates or peritonsillar abscess.  There is no hoarse or muffled voice.  There is no drooling.  No cervical  lymphadenopathy.   ED Results / Procedures / Treatments  Labs (all labs ordered are listed, but only abnormal results are displayed) Labs Reviewed  CBC WITH DIFFERENTIAL/PLATELET - Abnormal; Notable for the following components:      Result Value   WBC 11.6 (*)    Neutro Abs 7.8 (*)    All other components within normal limits  COMPREHENSIVE METABOLIC PANEL - Abnormal; Notable for the following components:   Glucose, Bld 105 (*)    All other components within normal limits  RESP PANEL BY RT-PCR (FLU A&B, COVID) ARPGX2  GROUP A STREP BY PCR  LIPASE, BLOOD     EKG  None   RADIOLOGY I have independently visualized and interpreted patient's chest x-ray as well as noted the radiology interpretation:  Chest x-ray: No acute cardiopulmonary process  Official radiology report(s): DG Chest 2 View  Result Date: 10/27/2021 CLINICAL DATA:  Cough EXAM: CHEST - 2 VIEW COMPARISON:  03/23/2018 FINDINGS: Cardiac and mediastinal contours are within normal limits. No focal pulmonary opacity. No pleural  effusion or pneumothorax. No acute osseous abnormality. IMPRESSION: No acute cardiopulmonary process. Electronically Signed   By: Merilyn Baba M.D.   On: 10/27/2021 02:10     PROCEDURES:  Critical Care performed: No  Procedures   MEDICATIONS ORDERED IN ED: Medications  sodium chloride 0.9 % bolus 1,000 mL (1,000 mLs Intravenous New Bag/Given 10/27/21 0305)  ondansetron (ZOFRAN) injection 4 mg (4 mg Intravenous Given 10/27/21 0307)  ketorolac (TORADOL) 30 MG/ML injection 15 mg (15 mg Intravenous Given 10/27/21 0308)  hydrOXYzine (ATARAX) tablet 50 mg (50 mg Oral Given 10/27/21 0313)     IMPRESSION / MDM / ASSESSMENT AND PLAN / ED COURSE  I reviewed the triage vital signs and the nursing notes.                             36 year old male coming from [facility] with multiple medical complaints.  We will obtain lab work, chest x-ray.  Initiate IV fluid resuscitation, IV ketorolac for body aches, IV Zofran for nausea.  Will reassess.  Patient's presentation is most consistent with acute complicated illness / injury requiring diagnostic workup.  0230 Patient requesting something to help him sleep.  0428 Patient feeling significantly better.  Updated him on all test results which are unremarkable.  Will treat with Z-Pak for bronchitis, as needed ODT Zofran, as needed Vistaril for sleep, as needed Robitussin for cough.  Strict return precautions given.  Patient verbalizes understanding agrees with plan of care.  FINAL CLINICAL IMPRESSION(S) / ED DIAGNOSES   Final diagnoses:  Generalized weakness  Acute cough  Bronchitis     Rx / DC Orders   ED Discharge Orders     None        Note:  This document was prepared using Dragon voice recognition software and may include unintentional dictation errors.   Paulette Blanch, MD 10/27/21 0600

## 2021-10-27 NOTE — ED Triage Notes (Signed)
Patient ambulatory to triage with steady gait, without difficulty or distress noted; pt currently residing at [facility] for suboxone detox (last use wk ago); c/o generalized body aches, nonprod cough, congestion and sore throat; also reports that his BP was 165/97 tonight

## 2021-10-27 NOTE — Discharge Instructions (Addendum)
1.  Take antibiotic as prescribed (Z-Pak). 2.  You may take these medicines as needed: Zofran for nausea Vistaril for sleep Robitussin for cough Toradol for body aches 3.  Drink plenty of fluids daily. 4.  Return to the ER for worsening symptoms, persistent vomiting, difficulty breathing or other concerns.

## 2021-10-27 NOTE — ED Notes (Signed)
As per MD request pt triage acuity changed from level 4 to level 3

## 2021-10-27 NOTE — ED Notes (Signed)
As per recovery facility staff pt will be picked up in about 20 min

## 2022-05-10 ENCOUNTER — Ambulatory Visit: Payer: Medicaid Other | Admitting: Nurse Practitioner

## 2022-05-10 ENCOUNTER — Encounter: Payer: Self-pay | Admitting: Nurse Practitioner

## 2022-05-10 VITALS — BP 135/72 | HR 79 | Temp 98.5°F | Resp 16 | Ht 68.0 in | Wt 147.2 lb

## 2022-05-10 DIAGNOSIS — B182 Chronic viral hepatitis C: Secondary | ICD-10-CM | POA: Diagnosis not present

## 2022-05-10 DIAGNOSIS — F331 Major depressive disorder, recurrent, moderate: Secondary | ICD-10-CM

## 2022-05-10 DIAGNOSIS — R0789 Other chest pain: Secondary | ICD-10-CM | POA: Diagnosis not present

## 2022-05-10 DIAGNOSIS — I10 Essential (primary) hypertension: Secondary | ICD-10-CM

## 2022-05-10 DIAGNOSIS — F1490 Cocaine use, unspecified, uncomplicated: Secondary | ICD-10-CM

## 2022-05-10 DIAGNOSIS — E782 Mixed hyperlipidemia: Secondary | ICD-10-CM

## 2022-05-10 DIAGNOSIS — F410 Panic disorder [episodic paroxysmal anxiety] without agoraphobia: Secondary | ICD-10-CM

## 2022-05-10 DIAGNOSIS — F411 Generalized anxiety disorder: Secondary | ICD-10-CM

## 2022-05-10 MED ORDER — SERTRALINE HCL 100 MG PO TABS: 100.0000 mg | ORAL_TABLET | Freq: Every day | ORAL | 1 refills | Status: AC

## 2022-05-10 MED ORDER — BUSPIRONE HCL 10 MG PO TABS: 10.0000 mg | ORAL_TABLET | Freq: Two times a day (BID) | ORAL | 3 refills | Status: AC

## 2022-05-10 NOTE — Progress Notes (Signed)
[facility] [address] [place], [place] [address]  Internal MEDICINE  Office Visit Note  Patient Name: Thomas Benitez  [id number]  [id number]  Date of Service: 05/10/2022   Complaints/HPI Pt is here for establishment of PCP. Chief Complaint  Patient presents with   New Patient (Initial Visit)    Referrals, check up    HPI Thomas Benitez presents for a new patient visit to establish care.  Well-appearing 37 y.o. male with depression, anxiety, polysubstance abuse, and hepatitis C. Also is on suboxone treatment. Currently looking for a new clinic. He also has anxiety and depression and wants to be set up with psychiatry .  He has hepatitis C and wants to take the treatment for hepatitis C, so he needs a GI referral as well.  Has chest pain off and on for a while now.  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  Alcohol use: rarely.  Illicit drug use: used cocaine last weekend. Also has a history of using heroin and marijuana.  Labs: due for routine labs  New or worsening pain: none    Current Medication: Outpatient Encounter Medications as of 05/10/2022  Medication Sig   busPIRone (BUSPAR) 10 MG tablet Take 1 tablet (10 mg total) by mouth 2 (two) times daily.   sertraline (ZOLOFT) 100 MG tablet Take 1 tablet (100 mg total) by mouth daily.   buprenorphine-naloxone (SUBOXONE) 8-2 mg SUBL SL tablet Place 1 tablet under the tongue daily.   [DISCONTINUED] azithromycin (ZITHROMAX Z-PAK) 250 MG tablet Take 2 PO day #1, then 1 PO daily until finished (Patient not taking: Reported on 05/10/2022)   [DISCONTINUED] chlorhexidine (PERIDEX) 0.12 % solution Use as directed 15 mLs in the mouth or throat 2 (two) times daily. (Patient not taking: Reported on 05/10/2022)   [DISCONTINUED] guaiFENesin (ROBITUSSIN) 100 MG/5ML liquid Take 5 mLs by mouth every 4 (four) hours as needed for cough or to loosen  phlegm. (Patient not taking: Reported on 05/10/2022)   [DISCONTINUED] hydrOXYzine (ATARAX) 25 MG tablet Take 1 tablet (25 mg total) by mouth every 6 (six) hours as needed for itching. (Patient not taking: Reported on 05/10/2022)   [DISCONTINUED] ketorolac (TORADOL) 10 MG tablet Take 1 tablet (10 mg total) by mouth every 6 (six) hours as needed. (Patient not taking: Reported on 05/10/2022)   [DISCONTINUED] metroNIDAZOLE (FLAGYL) 500 MG tablet Take 1 tablet (500 mg total) by mouth 2 (two) times daily. (Patient not taking: Reported on 05/10/2022)   [DISCONTINUED] ondansetron (ZOFRAN-ODT) 4 MG disintegrating tablet Take 1 tablet (4 mg total) by mouth every 8 (eight) hours as needed for nausea or vomiting. (Patient not taking: Reported on 05/10/2022)   No facility-administered encounter medications on file as of 05/10/2022.    Surgical History: History reviewed. No pertinent surgical history.  Medical History: Past Medical History:  Diagnosis Date   Cocaine addiction ([facility])    Depression    Hepatitis C    Hypertension    Seizures ([facility])    pt states "i only had one before"    Substance abuse ([facility])     Family History: Family History  Problem Relation Age of Onset   Heart attack Father 78   Ulcers Father        Bleeding    Social History   Socioeconomic History   Marital status: Legally Separated    Spouse name: Not on file  Number of children: Not on file   Years of education: Not on file   Highest education level: Not on file  Occupational History   Not on file  Tobacco Use   Smoking status: Every Day    Packs/day: 1    Types: Cigarettes   Smokeless tobacco: Never  Vaping Use   Vaping Use: Never used  Substance and Sexual Activity   Alcohol use: No    Comment: 4 40's a day last drink 7 weeks ago   Drug use: Not Currently    Types: Marijuana, Cocaine    Comment: Heroin - last used 1 month ago   Sexual activity: Not on file  Other Topics Concern   Not on file  Social History  Narrative   Not on file   Social Determinants of Health   Financial Resource Strain: Not on file  Food Insecurity: Not on file  Transportation Needs: Not on file  Physical Activity: Not on file  Stress: Not on file  Social Connections: Not on file  Intimate Partner Violence: Not on file     Review of Systems  Constitutional:  Negative for chills, fatigue and unexpected weight change.  HENT:  Negative for congestion, postnasal drip, rhinorrhea, sneezing and sore throat.   Eyes:  Negative for redness.  Respiratory: Negative.  Negative for cough, chest tightness, shortness of breath and wheezing.   Cardiovascular: Negative.  Negative for chest pain and palpitations.  Gastrointestinal:  Negative for abdominal pain, constipation, diarrhea, nausea and vomiting.  Genitourinary:  Negative for dysuria and frequency.  Musculoskeletal:  Negative for arthralgias, back pain, joint swelling and neck pain.  Skin:  Negative for rash.  Neurological: Negative.  Negative for tremors and numbness.  Hematological:  Negative for adenopathy. Does not bruise/bleed easily.  Psychiatric/Behavioral:  Negative for behavioral problems (Depression), sleep disturbance and suicidal ideas. The patient is not nervous/anxious.     Vital Signs: BP 135/72 Comment: 140/90  Pulse 79   Temp 98.5 F (36.9 C)   Resp 16   Ht 5\' 8"  (1.727 m)   Wt 147 lb 3.2 oz (66.8 kg)   SpO2 98%   BMI 22.38 kg/m    Physical Exam Vitals reviewed.  Constitutional:      General: He is not in acute distress.    Appearance: Normal appearance. He is normal weight. He is not ill-appearing.  HENT:     Head: Normocephalic and atraumatic.  Eyes:     Pupils: Pupils are equal, round, and reactive to light.  Cardiovascular:     Rate and Rhythm: Normal rate and regular rhythm.  Pulmonary:     Effort: Pulmonary effort is normal. No respiratory distress.  Neurological:     Mental Status: He is alert and oriented to person, place, and  time.  Psychiatric:        Mood and Affect: Mood normal.        Behavior: Behavior normal.       Assessment/Plan: 1. Chronic hepatitis C without hepatic coma (HCC) Referred to GI for treatment of hepatitis C.  - Ambulatory referral to Gastroenterology  2. Mixed hyperlipidemia Routine labs ordered  - CBC with Differential/Platelet - CMP14+EGFR - Lipid Profile - TSH + free T4  3. Atypical chest pain Has off and on chest pain. Echo ordered and routine labs ordered  - ECHOCARDIOGRAM COMPLETE; Future - CBC with Differential/Platelet - CMP14+EGFR - Lipid Profile - TSH + free T4  4. Cocaine use Echocardiogram ordered as well as  routine labs  - ECHOCARDIOGRAM COMPLETE; Future - CBC with Differential/Platelet - CMP14+EGFR - Lipid Profile - TSH + free T4  5. Moderate episode of recurrent major depressive disorder (HCC) Continue sertraline and start buspirone as prescribed. Referred to psychiatry.  - Ambulatory referral to Psychiatry - sertraline (ZOLOFT) 100 MG tablet; Take 1 tablet (100 mg total) by mouth daily.  Dispense: 90 tablet; Refill: 1 - busPIRone (BUSPAR) 10 MG tablet; Take 1 tablet (10 mg total) by mouth 2 (two) times daily.  Dispense: 60 tablet; Refill: 3  6. Generalized anxiety disorder with panic attacks Continue sertraline as prescribed and start buspirone as prescribed. Referred to psychiatry.  - Ambulatory referral to Psychiatry - sertraline (ZOLOFT) 100 MG tablet; Take 1 tablet (100 mg total) by mouth daily.  Dispense: 90 tablet; Refill: 1 - busPIRone (BUSPAR) 10 MG tablet; Take 1 tablet (10 mg total) by mouth 2 (two) times daily.  Dispense: 60 tablet; Refill: 3    General Counseling: Tajee verbalizes understanding of the findings of todays visit and agrees with plan of treatment. I have discussed any further diagnostic evaluation that may be needed or ordered today. We also reviewed his medications today. he has been encouraged to call the office with any  questions or concerns that should arise related to todays visit.    Orders Placed This Encounter  Procedures   CBC with Differential/Platelet   CMP14+EGFR   Lipid Profile   TSH + free T4   Ambulatory referral to Psychiatry   Ambulatory referral to Gastroenterology   ECHOCARDIOGRAM COMPLETE    Meds ordered this encounter  Medications   sertraline (ZOLOFT) 100 MG tablet    Sig: Take 1 tablet (100 mg total) by mouth daily.    Dispense:  90 tablet    Refill:  1   busPIRone (BUSPAR) 10 MG tablet    Sig: Take 1 tablet (10 mg total) by mouth 2 (two) times daily.    Dispense:  60 tablet    Refill:  3    Return for CPE, Dodi Leu PCP at earliest opening available. and get labs done prior to visit. .  Time spent:30 Minutes Time spent with patient included reviewing progress notes, labs, imaging studies, and discussing plan for follow up.   [place] Controlled Substance Database was reviewed by me for overdose risk score (ORS)   This patient was seen by Sallyanne Kuster, FNP-C in collaboration with Dr. Beverely Risen as a part of collaborative care agreement.   Braulio Kiedrowski R. Tedd Sias, MSN, FNP-C Internal Medicine

## 2022-05-16 ENCOUNTER — Telehealth: Payer: Self-pay | Admitting: Nurse Practitioner

## 2022-05-16 NOTE — Telephone Encounter (Signed)
Awaiting 05/10/22 office notes for Psy referral-Toni

## 2022-05-21 ENCOUNTER — Encounter: Payer: Self-pay | Admitting: Nurse Practitioner

## 2022-05-23 ENCOUNTER — Telehealth: Payer: Self-pay | Admitting: Nurse Practitioner

## 2022-05-23 NOTE — Telephone Encounter (Signed)
Psy referral faxed to [facility]; [phone]

## 2022-05-30 ENCOUNTER — Emergency Department
Admission: EM | Admit: 2022-05-30 | Discharge: 2022-05-30 | Disposition: A | Discharge status: Home / Self Care | Payer: Medicaid Other | Attending: Emergency Medicine | Admitting: Emergency Medicine

## 2022-05-30 ENCOUNTER — Encounter: Payer: Self-pay | Admitting: Emergency Medicine

## 2022-05-30 ENCOUNTER — Other Ambulatory Visit: Payer: Self-pay

## 2022-05-30 ENCOUNTER — Emergency Department: Payer: Medicaid Other

## 2022-05-30 DIAGNOSIS — S39012A Strain of muscle, fascia and tendon of lower back, initial encounter: Secondary | ICD-10-CM | POA: Diagnosis not present

## 2022-05-30 DIAGNOSIS — Y92410 Unspecified street and highway as the place of occurrence of the external cause: Secondary | ICD-10-CM | POA: Insufficient documentation

## 2022-05-30 DIAGNOSIS — I10 Essential (primary) hypertension: Secondary | ICD-10-CM | POA: Insufficient documentation

## 2022-05-30 DIAGNOSIS — M545 Low back pain, unspecified: Secondary | ICD-10-CM | POA: Diagnosis present

## 2022-05-30 MED ORDER — MUSCLE RUB 10-15 % EX CREA: 1.0000 | TOPICAL_CREAM | CUTANEOUS | 0 refills | Status: AC | PRN
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
## 2022-05-30 NOTE — Discharge Instructions (Addendum)
Take acetaminophen 650 mg and ibuprofen 400 mg every 6 hours for pain.  Take with food. Take muscle rub as prescribed.  See your doctor next week for checkup.

## 2022-06-05 ENCOUNTER — Ambulatory Visit: Admission: RE | Admit: 2022-06-05 | Payer: Medicaid Other | Source: Ambulatory Visit

## 2022-06-07 ENCOUNTER — Telehealth: Payer: Self-pay | Admitting: Nurse Practitioner

## 2022-06-07 NOTE — Telephone Encounter (Signed)
Lvm to girlfriend to move appt time-nm

## 2022-06-12 ENCOUNTER — Encounter: Payer: Self-pay | Admitting: Nurse Practitioner

## 2022-06-12 ENCOUNTER — Ambulatory Visit: Payer: Medicaid Other | Admitting: Nurse Practitioner

## 2022-06-12 VITALS — BP 138/84 | HR 75 | Temp 98.3°F | Resp 16 | Ht 68.0 in | Wt 145.2 lb

## 2022-06-12 DIAGNOSIS — B182 Chronic viral hepatitis C: Secondary | ICD-10-CM | POA: Diagnosis not present

## 2022-06-12 DIAGNOSIS — R300 Dysuria: Secondary | ICD-10-CM

## 2022-06-12 DIAGNOSIS — Z0001 Encounter for general adult medical examination with abnormal findings: Secondary | ICD-10-CM

## 2022-06-12 DIAGNOSIS — F411 Generalized anxiety disorder: Secondary | ICD-10-CM | POA: Diagnosis not present

## 2022-06-12 DIAGNOSIS — F410 Panic disorder [episodic paroxysmal anxiety] without agoraphobia: Secondary | ICD-10-CM | POA: Diagnosis not present
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
## 2022-06-13 LAB — UA/M W/RFLX CULTURE, ROUTINE
Bilirubin, UA: NEGATIVE
Glucose, UA: NEGATIVE
Ketones, UA: NEGATIVE
Leukocytes,UA: NEGATIVE
Nitrite, UA: NEGATIVE
Protein,UA: NEGATIVE
RBC, UA: NEGATIVE
Specific Gravity, UA: 1.01 (ref 1.005–1.030)
Urobilinogen, Ur: 0.2 mg/dL (ref 0.2–1.0)
pH, UA: 6 (ref 5.0–7.5)

## 2022-06-13 LAB — MICROSCOPIC EXAMINATION
Bacteria, UA: NONE SEEN
Casts: NONE SEEN /lpf
Epithelial Cells (non renal): NONE SEEN /hpf (ref 0–10)
RBC, Urine: NONE SEEN /hpf (ref 0–2)
WBC, UA: NONE SEEN /hpf (ref 0–5)

## 2022-06-16 ENCOUNTER — Encounter: Payer: Self-pay | Admitting: Nurse Practitioner

## 2022-07-15 ENCOUNTER — Emergency Department
Admission: EM | Admit: 2022-07-15 | Discharge: 2022-07-15 | Discharge status: Against Medical Advice | Payer: MEDICAID | Attending: Emergency Medicine | Admitting: Emergency Medicine

## 2022-07-15 DIAGNOSIS — Z5321 Procedure and treatment not carried out due to patient leaving prior to being seen by health care provider: Secondary | ICD-10-CM | POA: Diagnosis not present

## 2022-07-15 DIAGNOSIS — T50901A Poisoning by unspecified drugs, medicaments and biological substances, accidental (unintentional), initial encounter: Secondary | ICD-10-CM | POA: Diagnosis not present
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
## 2022-07-15 NOTE — ED Notes (Addendum)
Patient ambulatory in triage area requesting for IV to be removed and to go home. RN spoke with pt regarding risk of leaving prior to completion of evaluation period with consideration of narcan being administered pta in hospital. Pt verbalizes understanding of risk and verbalizes that he understands that he can and should return should any further concerns arise. Pt is noted to be alert and oriented and breathing unlabored with symmetric chest rise and fall. Pt visualized ambulating from department with independent and steady gait. IV removed prior to pt leaving.

## 2022-08-10 ENCOUNTER — Telehealth: Payer: Self-pay | Admitting: Nurse Practitioner

## 2022-08-10 NOTE — Telephone Encounter (Signed)
Per patient's request, [facility] referral faxed to [employer]; [phone]

## 2022-12-12 ENCOUNTER — Ambulatory Visit: Payer: MEDICAID | Admitting: Nurse Practitioner

## 2023-06-07 ENCOUNTER — Telehealth: Payer: Self-pay | Admitting: Nurse Practitioner

## 2023-06-07 NOTE — Telephone Encounter (Signed)
 VM not set up, sent mychart message to confirm 06/13/23 appointment-Toni

## 2023-06-13 ENCOUNTER — Encounter: Payer: MEDICAID | Admitting: Nurse Practitioner

## 2023-06-15 ENCOUNTER — Telehealth: Payer: Self-pay | Admitting: Nurse Practitioner

## 2023-06-15 NOTE — Telephone Encounter (Signed)
 No vm, sent message to reschedule 06/13/2023 missed appointment-Toni

## 2023-06-27 ENCOUNTER — Telehealth: Payer: Self-pay | Admitting: Nurse Practitioner

## 2023-06-27 NOTE — Telephone Encounter (Signed)
 Left 3rd vm to schedule missed appointment. Mailed letter-Toni

## 2023-07-09 ENCOUNTER — Other Ambulatory Visit: Payer: Self-pay | Admitting: Nurse Practitioner

## 2023-07-09 DIAGNOSIS — F331 Major depressive disorder, recurrent, moderate: Secondary | ICD-10-CM

## 2023-07-09 DIAGNOSIS — F410 Panic disorder [episodic paroxysmal anxiety] without agoraphobia: Secondary | ICD-10-CM

## 2023-07-09 NOTE — Telephone Encounter (Signed)
 He is not returning calls. Mailed him letter
